# Patient Record
Sex: Male | Born: 1970 | Race: White | Hispanic: No | Marital: Married | Smoking: Never smoker
Health system: Southern US, Community
[De-identification: ages and names within clinical notes are randomized; demographics above are authoritative.]

## PROBLEM LIST (undated history)

## (undated) DIAGNOSIS — R030 Elevated blood-pressure reading, without diagnosis of hypertension: Secondary | ICD-10-CM

## (undated) DIAGNOSIS — Z87442 Personal history of urinary calculi: Secondary | ICD-10-CM

## (undated) DIAGNOSIS — IMO0001 Reserved for inherently not codable concepts without codable children: Secondary | ICD-10-CM

## (undated) HISTORY — DX: Personal history of urinary calculi: Z87.442

## (undated) HISTORY — DX: Elevated blood-pressure reading, without diagnosis of hypertension: R03.0

## (undated) HISTORY — DX: Reserved for inherently not codable concepts without codable children: IMO0001

---

## 2004-05-24 ENCOUNTER — Ambulatory Visit: Payer: Self-pay | Admitting: Family Medicine

## 2004-10-31 ENCOUNTER — Ambulatory Visit: Payer: Self-pay | Admitting: Family Medicine

## 2005-01-20 ENCOUNTER — Ambulatory Visit: Payer: Self-pay | Admitting: Family Medicine

## 2006-03-16 ENCOUNTER — Ambulatory Visit: Payer: Self-pay | Admitting: Specialist

## 2006-11-02 ENCOUNTER — Ambulatory Visit: Payer: Self-pay | Admitting: Family Medicine

## 2006-11-02 DIAGNOSIS — I10 Essential (primary) hypertension: Secondary | ICD-10-CM

## 2006-11-04 ENCOUNTER — Telehealth: Payer: Self-pay | Admitting: Family Medicine

## 2006-12-01 ENCOUNTER — Ambulatory Visit: Payer: Self-pay | Admitting: Family Medicine

## 2006-12-01 LAB — CONVERTED CEMR LAB
CO2: 29 meq/L (ref 19–32)
Creatinine, Ser: 0.9 mg/dL (ref 0.4–1.5)
Potassium: 4.4 meq/L (ref 3.5–5.1)
Sodium: 142 meq/L (ref 135–145)

## 2006-12-03 ENCOUNTER — Ambulatory Visit: Payer: Self-pay | Admitting: Family Medicine

## 2007-06-14 ENCOUNTER — Ambulatory Visit: Payer: Self-pay | Admitting: Family Medicine

## 2007-06-14 DIAGNOSIS — E1169 Type 2 diabetes mellitus with other specified complication: Secondary | ICD-10-CM | POA: Insufficient documentation

## 2007-06-14 DIAGNOSIS — E785 Hyperlipidemia, unspecified: Secondary | ICD-10-CM

## 2007-06-14 LAB — CONVERTED CEMR LAB
ALT: 24 units/L (ref 0–53)
AST: 20 units/L (ref 0–37)
Albumin: 4.4 g/dL (ref 3.5–5.2)
Alkaline Phosphatase: 76 units/L (ref 39–117)
BUN: 9 mg/dL (ref 6–23)
Calcium: 9.4 mg/dL (ref 8.4–10.5)
Chloride: 107 meq/L (ref 96–112)
Cholesterol: 174 mg/dL (ref 0–200)
GFR calc non Af Amer: 81 mL/min
LDL Cholesterol: 111 mg/dL — ABNORMAL HIGH (ref 0–99)
VLDL: 27 mg/dL (ref 0–40)

## 2007-06-17 ENCOUNTER — Ambulatory Visit: Payer: Self-pay | Admitting: Family Medicine

## 2007-06-17 DIAGNOSIS — E119 Type 2 diabetes mellitus without complications: Secondary | ICD-10-CM | POA: Insufficient documentation

## 2007-06-17 DIAGNOSIS — E1169 Type 2 diabetes mellitus with other specified complication: Secondary | ICD-10-CM | POA: Insufficient documentation

## 2007-06-17 DIAGNOSIS — R7303 Prediabetes: Secondary | ICD-10-CM | POA: Insufficient documentation

## 2009-11-06 ENCOUNTER — Encounter (INDEPENDENT_AMBULATORY_CARE_PROVIDER_SITE_OTHER): Payer: Self-pay | Admitting: *Deleted

## 2010-05-01 NOTE — Letter (Signed)
Summary: Nadara Eaton letter  [facility]  [address] [place], [place] [address]   Phone: [phone]  Fax: [phone]       11/06/2009 MRN: [id number]  JONIEL GRAUMANN [address] [place], [place]  [address]  Dear Mr. ABBOTT, [facility] [place], and [facility] announce the retirement of Arta Silence, M.D., from full-time practice at the [facility] office effective September 27, 2009 and his plans of returning part-time.  It is important to Dr. Hetty Ely and to our practice that you understand that [facility] has seven physicians in our office for your health care needs.  We will continue to offer the same exceptional care that you have today.    Dr. Hetty Ely has spoken to many of you about his plans for retirement and returning part-time in the fall.   We will continue to work with you through the transition to schedule appointments for you in the office and meet the high standards that [place] is committed to.   Again, it is with great pleasure that we share the news that Dr. Hetty Ely will return to [facility] at [facility] in October of 2011 with a reduced schedule.    If you have any questions, or would like to request an appointment with one of our physicians, please call us at [phone] and press the option for Scheduling an appointment.  We take pleasure in providing you with excellent patient care and look forward to seeing you at your next office visit.  Our [facility] Physicians are:  Tillman Abide, M.D. Laurita Quint, M.D. Roxy Manns, M.D. Kerby Nora, M.D. Hannah Beat, M.D. Ruthe Mannan, M.D. We proudly welcomed Raechel Ache, M.D. and Eustaquio Boyden, M.D. to the practice in July/August 2011.  Sincerely,  [facility]

## 2011-04-01 DIAGNOSIS — Z87442 Personal history of urinary calculi: Secondary | ICD-10-CM

## 2011-04-01 HISTORY — DX: Personal history of urinary calculi: Z87.442

## 2011-04-10 ENCOUNTER — Emergency Department (HOSPITAL_COMMUNITY)
Admission: EM | Admit: 2011-04-10 | Discharge: 2011-04-10 | Disposition: A | Discharge status: Home / Self Care | Payer: 59 | Attending: Emergency Medicine | Admitting: Emergency Medicine

## 2011-04-10 ENCOUNTER — Encounter (HOSPITAL_COMMUNITY): Payer: Self-pay | Admitting: *Deleted

## 2011-04-10 ENCOUNTER — Emergency Department (HOSPITAL_COMMUNITY): Payer: 59

## 2011-04-10 DIAGNOSIS — R109 Unspecified abdominal pain: Secondary | ICD-10-CM | POA: Insufficient documentation

## 2011-04-10 DIAGNOSIS — N201 Calculus of ureter: Secondary | ICD-10-CM | POA: Insufficient documentation

## 2011-04-10 DIAGNOSIS — N200 Calculus of kidney: Secondary | ICD-10-CM

## 2011-04-10 LAB — URINALYSIS, ROUTINE W REFLEX MICROSCOPIC
Glucose, UA: NEGATIVE mg/dL
Leukocytes, UA: NEGATIVE
Protein, ur: NEGATIVE mg/dL
Specific Gravity, Urine: 1.018 (ref 1.005–1.030)
Urobilinogen, UA: 0.2 mg/dL (ref 0.0–1.0)

## 2011-04-10 LAB — URINE MICROSCOPIC-ADD ON

## 2011-04-10 MED ORDER — ONDANSETRON HCL 4 MG/2ML IJ SOLN
4.0000 mg | Freq: Once | INTRAMUSCULAR | Status: AC
  Administered 2011-04-10: 4 mg via INTRAVENOUS
  Filled 2011-04-10: qty 2

## 2011-04-10 MED ORDER — ONDANSETRON HCL 8 MG PO TABS: 8.0000 mg | ORAL_TABLET | ORAL | Status: AC | PRN

## 2011-04-10 MED ORDER — KETOROLAC TROMETHAMINE 30 MG/ML IJ SOLN
30.0000 mg | Freq: Once | INTRAMUSCULAR | Status: AC
  Administered 2011-04-10: 30 mg via INTRAVENOUS
  Filled 2011-04-10: qty 1

## 2011-04-10 MED ORDER — TAMSULOSIN HCL 0.4 MG PO CAPS: 0.4000 mg | ORAL_CAPSULE | Freq: Every day | ORAL | Status: DC

## 2011-04-10 MED ORDER — OXYCODONE-ACETAMINOPHEN 5-325 MG PO TABS: 2.0000 | ORAL_TABLET | ORAL | Status: AC | PRN

## 2011-04-10 MED ORDER — HYDROMORPHONE HCL PF 1 MG/ML IJ SOLN
1.0000 mg | Freq: Once | INTRAMUSCULAR | Status: AC
  Administered 2011-04-10: 1 mg via INTRAVENOUS
  Filled 2011-04-10: qty 1

## 2011-04-10 NOTE — ED Notes (Signed)
Pt states he started to have right side flank pain on Friday. Pt states pain just felt like pressure. Pt states today pain have move to right lower abdominal area. Pt states he also noted blood in his urine. Pt also c/o n/v x1.

## 2011-04-10 NOTE — ED Provider Notes (Addendum)
History     CSN: [id number]  Arrival date & time 04/10/11  0719   First MD Initiated Contact with Patient 04/10/11 [phone]      Chief Complaint  Patient presents with  . Flank Pain    (Consider location/radiation/quality/duration/timing/severity/associated sxs/prior treatment) HPI... abrupt onset right flank pain this morning with radiation to right lower quadrant. Complains of urgency, decreased urinary flow, hematuria. Similar episode approximately one week ago.  Pain is severe. Nothing makes it better or worse. Described as sharp  History reviewed. No pertinent past medical history.  History reviewed. No pertinent past surgical history.  History reviewed. No pertinent family history.  History  Substance Use Topics  . Smoking status: Never Smoker   . Smokeless tobacco: Not on file  . Alcohol Use: No      Review of Systems  All other systems reviewed and are negative.    Allergies  Review of patient's allergies indicates no known allergies.  Home Medications   Current Outpatient Rx  Name Route Sig Dispense Refill  . HYDROCODONE-ACETAMINOPHEN 7.5-325 MG PO TABS Oral Take 1 tablet by mouth every 4 (four) hours as needed. For pain    . MAGNESIUM CHLORIDE 64 MG PO TBEC Oral Take 1 tablet by mouth daily.    Marland Kitchen VITAMIN B-6 100 MG PO TABS Oral Take 100 mg by mouth daily.      BP 167/111  Pulse 55  Temp(Src) 97.6 F (36.4 C) (Oral)  Resp 22  Ht 5\' 11"  (1.803 m)  Wt 185 lb (83.915 kg)  BMI 25.80 kg/m2  SpO2 100%  Physical Exam  Nursing note and vitals reviewed. Constitutional: He is oriented to person, place, and time. He appears well-developed and well-nourished.  HENT:  Head: Normocephalic and atraumatic.  Eyes: Conjunctivae and EOM are normal. Pupils are equal, round, and reactive to light.  Neck: Normal range of motion. Neck supple.  Cardiovascular: Normal rate and regular rhythm.   Pulmonary/Chest: Effort normal and breath sounds normal.  Abdominal: Soft.  Bowel sounds are normal.  Genitourinary:       Normal genitourinary exam  Musculoskeletal: Normal range of motion. Tenderness: minimal right flank tenderness.  Neurological: He is alert and oriented to person, place, and time.  Skin: Skin is warm and dry.  Psychiatric: He has a normal mood and affect.    ED Course  Procedures (including critical care time)   Labs Reviewed  URINALYSIS, ROUTINE W REFLEX MICROSCOPIC   No results found.   No diagnosis found. Results for orders placed during the hospital encounter of 04/10/11  URINALYSIS, ROUTINE W REFLEX MICROSCOPIC      Component Value Range   Color, Urine YELLOW  YELLOW    APPearance TURBID (*) CLEAR    Specific Gravity, Urine 1.018  1.005 - 1.030    pH 7.5  5.0 - 8.0    Glucose, UA NEGATIVE  NEGATIVE (mg/dL)   Hgb urine dipstick LARGE (*) NEGATIVE    Bilirubin Urine NEGATIVE  NEGATIVE    Ketones, ur NEGATIVE  NEGATIVE (mg/dL)   Protein, ur NEGATIVE  NEGATIVE (mg/dL)   Urobilinogen, UA 0.2  0.0 - 1.0 (mg/dL)   Nitrite NEGATIVE  NEGATIVE    Leukocytes, UA NEGATIVE  NEGATIVE   URINE MICROSCOPIC-ADD ON      Component Value Range   RBC / HPF 21-50  <3 (RBC/hpf)   Bacteria, UA MANY (*) RARE    Urine-Other AMORPHOUS URATES/PHOSPHATES    Ct Abdomen Pelvis Wo Contrast  04/10/2011  *  RADIOLOGY REPORT*  Clinical Data: Right flank and right abdominal pain.  Hematuria.  CT ABDOMEN AND PELVIS WITHOUT CONTRAST  Technique:  Multidetector CT imaging of the abdomen and pelvis was performed following the standard protocol without intravenous contrast.  Comparison: None.  Findings: Lung bases show minimal dependent atelectasis bilaterally.  Heart size normal.  No pericardial or pleural effusion.  Liver, gallbladder and adrenal glands are unremarkable.  Right kidney is edematous with perinephric and periureteric stranding. Mild right hydronephrosis secondary to a 2 mm stone at the right ureteral vesicle junction.  Left kidney, spleen, pancreas,  stomach and bowel are otherwise unremarkable.  Small bilateral inguinal hernias contain fat.  No pathologically enlarged lymph nodes.  No free fluid.  No worrisome lytic or sclerotic lesions. Advanced degenerative disc disease at L5-S1.  IMPRESSION: Minimally obstructing 2 mm right ureteral vesicle junction stone.  Original Report Authenticated By: Reyes Ivan, M.D.     MDM  History and physical most consistent with a kidney stone. Treat pain. Urinalysis. Patient has never had a CT scan so I will obtain same  Recheck at 1100.  Discussed CT scan with patient. Pain is under control. We'll discharge      Donnetta Hutching, MD 04/10/11 9147  Donnetta Hutching, MD 04/10/11 1118

## 2011-07-25 ENCOUNTER — Ambulatory Visit (INDEPENDENT_AMBULATORY_CARE_PROVIDER_SITE_OTHER): Payer: 59 | Admitting: Family Medicine

## 2011-07-25 VITALS — BP 148/89 | HR 67 | Temp 98.6°F | Resp 17 | Ht 69.5 in | Wt 189.8 lb

## 2011-07-25 DIAGNOSIS — M542 Cervicalgia: Secondary | ICD-10-CM

## 2011-07-25 DIAGNOSIS — L03221 Cellulitis of neck: Secondary | ICD-10-CM

## 2011-07-25 DIAGNOSIS — L0211 Cutaneous abscess of neck: Secondary | ICD-10-CM

## 2011-07-25 MED ORDER — DOXYCYCLINE HYCLATE 100 MG PO TABS: 100.0000 mg | ORAL_TABLET | Freq: Two times a day (BID) | ORAL | Status: AC

## 2011-07-25 NOTE — Progress Notes (Signed)
41 yo with 3-4 days of progressive redness, swelling and tenderness right anterior neck.  It has come to a head and is draining yellow green pus.  No prior h/o boils.  Daughter had cheek abscess couple months ago  O:  1 cm boil right anterior neck.  See PA note of I&D above  A:  Infected sebaceous cyst  P:  Doxycycline 100 bid Recheck 48 hours for removal of packing

## 2011-07-25 NOTE — Progress Notes (Signed)
  Subjective:    Patient ID: ASTOR GENTLE, male    DOB: 1971/01/27, 41 y.o.   MRN: [id number]  HPI    Review of Systems     Objective:   Physical Exam  Procedure Note:  VCO.  Neck wound cleansed with betadine and alcohol.  2 cc of 1% lidocaine with epinephrine injected and #15 blade used to open wound.  Culture obtained and thick greenish drainage expressed.  Irrigated with 1 cc of lidocaine and 1/4 packing placed, telfa dressing applied.  Pt tolerated well.      Assessment & Plan:  Abcess/cellulitis of right side neck.  Doxycycline 100 mg BID with food for 7 days.  RTC in 2 days for wound care.  Wound instructions given.

## 2011-07-27 ENCOUNTER — Ambulatory Visit (INDEPENDENT_AMBULATORY_CARE_PROVIDER_SITE_OTHER): Payer: 59 | Admitting: Physician Assistant

## 2011-07-27 VITALS — BP 126/84 | HR 62 | Temp 98.4°F | Resp 16 | Ht 70.0 in | Wt 198.0 lb

## 2011-07-27 DIAGNOSIS — L723 Sebaceous cyst: Secondary | ICD-10-CM

## 2011-07-27 NOTE — Progress Notes (Signed)
   Patient ID: Craig Wade MRN: [id number], DOB: 1970/05/22 41 y.o. Date of Encounter: 07/27/2011, 8:27 AM  Primary Physician: Eustaquio Boyden, MD, MD  Chief Complaint: Wound care   See previous note  HPI: 41 y.o. y/o male presents for wound care s/p I&D on 07/25/11 Doing well No issues or complaints Afebrile/ no chills No nausea or vomiting Tolerating Doxycycline Pain improved Daily dressing change Previous note reviewed  No past medical history on file.   Home Meds: Prior to Admission medications   Medication Sig Start Date End Date Taking? Authorizing Provider  doxycycline (VIBRA-TABS) 100 MG tablet Take 1 tablet (100 mg total) by mouth 2 (two) times daily. 07/25/11 08/04/11 Yes Elvina Sidle, MD  HYDROcodone-acetaminophen (NORCO) 7.5-325 MG per tablet Take 1 tablet by mouth every 4 (four) hours as needed. For pain   Yes Historical Provider, MD  magnesium chloride (SLOW-MAG) 64 MG TBEC Take 1 tablet by mouth daily.   Yes Historical Provider, MD  pyridOXINE (VITAMIN B-6) 100 MG tablet Take 100 mg by mouth daily.   Yes Historical Provider, MD  Tamsulosin HCl (FLOMAX) 0.4 MG CAPS Take 1 capsule (0.4 mg total) by mouth daily. 04/10/11  Yes Donnetta Hutching, MD    Allergies: No Known Allergies  ROS: Constitutional: Afebrile, no chills Cardiovascular: negative for chest pain or palpitations Dermatological: Positive for wound. Negative for erythema, pain, or warmth  GI: No nausea or vomiting   EXAM: Physical Exam: Blood pressure 136/78, pulse 62, temperature 98.4 F (36.9 C), temperature source Oral, resp. rate 16, height 5\' 10"  (1.778 m), weight 198 lb (89.812 kg)., Body mass index is 28.41 kg/(m^2). General: Well developed, well nourished, in no acute distress. Nontoxic appearing. Head: Normocephalic, atraumatic, sclera non-icteric.  Neck: Supple. Lungs: Breathing is unlabored. Heart: Normal rate. Skin:  Warm and moist. Dressing and packing in place. No induration, erythema,  or tenderness to palpation. Neuro: Alert and oriented X 3. Moves all extremities spontaneously. Normal gait.  Psych:  Responds to questions appropriately with a normal affect.   PROCEDURE: Dressing and packing removed. No purulence or sebaceous material expressed Wound bed healthy Irrigated with 1% plain lidocaine 5 cc. Repacked with 1/4 inch plain packing Dressing applied  LAB: Culture: No growth  A/P: 41 y.o. y/o male with cellulitis/abscess as above s/p I&D on 07/25/11 -Wound care per above -Continue Doxycycline -Pain well controlled -Daily dressing changes -Recheck 48 hours  Signed, Eula Listen, PA-C 07/27/2011 8:27 AM

## 2011-07-28 LAB — WOUND CULTURE
Gram Stain: NONE SEEN
Gram Stain: NONE SEEN

## 2011-07-29 ENCOUNTER — Ambulatory Visit (INDEPENDENT_AMBULATORY_CARE_PROVIDER_SITE_OTHER): Payer: 59 | Admitting: Physician Assistant

## 2011-07-29 VITALS — BP 142/95 | HR 58 | Temp 98.4°F | Resp 16 | Ht 69.5 in | Wt 192.8 lb

## 2011-07-29 DIAGNOSIS — L723 Sebaceous cyst: Secondary | ICD-10-CM

## 2011-07-29 DIAGNOSIS — L03221 Cellulitis of neck: Secondary | ICD-10-CM

## 2011-07-29 DIAGNOSIS — L738 Other specified follicular disorders: Secondary | ICD-10-CM

## 2011-07-29 DIAGNOSIS — L0211 Cutaneous abscess of neck: Secondary | ICD-10-CM

## 2011-07-29 NOTE — Progress Notes (Signed)
  Subjective:    Patient ID: Craig Wade, male    DOB: 05-11-1970, 41 y.o.   MRN: [id number]  HPI Presents for wound care s/p I&D of an abscess on the right anterior neck.   No pain.  Minimal drainage.  Tolerating doxycycline without difficulty.  Notes that the dressing change last night resulted in the packing falling out.    Review of Systems As above.    Objective:   Physical Exam  AAOx4. Vital signs noted.  Bandaid removed.  Scant erythema.  Minimal induration consistent with inflammatory changes of healing.  No drainage.  Non-tender.  Wound is closing.  No area to re-pack. Bandaid with antibiotic ointment applied.      Assessment & Plan:   1. Sebaceous cyst   2. Cellulitis and abscess of neck    Local wound care. Patient Instructions  Complete the antibiotics you were given.  Return for re-evaluation if your pain worsens, you develop increased swelling, redness or if drainage begins again.

## 2011-07-29 NOTE — Patient Instructions (Signed)
Complete the antibiotics you were given.  Return for re-evaluation if your pain worsens, you develop increased swelling, redness or if drainage begins again.

## 2011-12-26 ENCOUNTER — Encounter: Payer: Self-pay | Admitting: Family Medicine

## 2011-12-26 ENCOUNTER — Ambulatory Visit (INDEPENDENT_AMBULATORY_CARE_PROVIDER_SITE_OTHER): Payer: 59 | Admitting: Family Medicine

## 2011-12-26 VITALS — BP 136/88 | HR 63 | Temp 98.2°F | Wt 196.0 lb

## 2011-12-26 DIAGNOSIS — M7710 Lateral epicondylitis, unspecified elbow: Secondary | ICD-10-CM

## 2011-12-26 DIAGNOSIS — M7711 Lateral epicondylitis, right elbow: Secondary | ICD-10-CM

## 2011-12-26 MED ORDER — NAPROXEN 500 MG PO TABS: ORAL_TABLET | ORAL | Status: DC

## 2011-12-26 NOTE — Patient Instructions (Signed)
You have tennis elbow of both arms. Take naprosyn twice daily with food for next 5-7 days then just as needed. Do stretching exercises provided today. Continue using elbow strap. Try to back off repetitive work with hands. If not improving please let us know.

## 2011-12-26 NOTE — Progress Notes (Signed)
  Subjective:    Patient ID: Craig Wade, male    DOB: May 30, 1970, 41 y.o.   MRN: [id number]  HPI CC: elbow pain  2 mo h/o R>L elbow pain.  Worse with lifting with forearm in pronation and with palpation.  Also tender with grazing edge of elbow against anything.  Yesterday took ibuprofen which helped.  Repetitive wrist and elbow motion at work.  More busy at work last 2 months.  Using tennis elbow strap. Has had this in past.  Medications and allergies reviewed and updated in chart.  Past histories reviewed and updated if relevant as below. Patient Active Problem List  Diagnosis  . HYPERCHOLESTEROLEMIA  . HYPERTENSION, BENIGN ESSENTIAL  . HYPERGLYCEMIA   Past Medical History  Diagnosis Date  . History of kidney stones 04/2011  . Elevated BP     improved with weight loss   No past surgical history on file. History  Substance Use Topics  . Smoking status: Never Smoker   . Smokeless tobacco: Never Used  . Alcohol Use: No   Family History  Problem Relation Age of Onset  . Stroke Paternal Grandfather 57  . Cancer Paternal Grandmother 2    liver, nondrinker  . Coronary artery disease Neg Hx   . Diabetes Neg Hx    No Known Allergies No current outpatient prescriptions on file prior to visit.     Review of Systems Per HPI    Objective:   Physical Exam  Nursing note and vitals reviewed. Constitutional: He appears well-developed and well-nourished. No distress.  Musculoskeletal: He exhibits no edema.       FROM at elbows, wrists. Tender at lateral epicondyles bilaterally R>L. No other elbow pain with palpation. Pain with extension against resistance. Not significant pain with pronation/supination bilaterally.  Neurological: He is alert. He has normal strength. No sensory deficit.  Skin: Skin is warm and dry. No rash noted.  Psychiatric: He has a normal mood and affect.       Assessment & Plan:

## 2011-12-26 NOTE — Assessment & Plan Note (Signed)
See pt instructions for plan. Stretching/strengthening exercises provided from [facility] pt advisor.

## 2013-03-25 ENCOUNTER — Ambulatory Visit (INDEPENDENT_AMBULATORY_CARE_PROVIDER_SITE_OTHER): Payer: 59 | Admitting: Family Medicine

## 2013-03-25 ENCOUNTER — Encounter: Payer: Self-pay | Admitting: Family Medicine

## 2013-03-25 VITALS — BP 152/110 | HR 100 | Temp 99.6°F | Wt 196.8 lb

## 2013-03-25 DIAGNOSIS — R030 Elevated blood-pressure reading, without diagnosis of hypertension: Secondary | ICD-10-CM

## 2013-03-25 DIAGNOSIS — IMO0001 Reserved for inherently not codable concepts without codable children: Secondary | ICD-10-CM | POA: Insufficient documentation

## 2013-03-25 DIAGNOSIS — Z23 Encounter for immunization: Secondary | ICD-10-CM

## 2013-03-25 DIAGNOSIS — J111 Influenza due to unidentified influenza virus with other respiratory manifestations: Secondary | ICD-10-CM

## 2013-03-25 MED ORDER — HYDROCODONE-HOMATROPINE 5-1.5 MG/5ML PO SYRP: 5.0000 mL | ORAL_SOLUTION | Freq: Three times a day (TID) | ORAL | Status: DC | PRN

## 2013-03-25 NOTE — Assessment & Plan Note (Signed)
Tired but nontoxic. Clinical dx based on sxs and prevalence currently in community Did not swab today. Supportive care as per instructions. Hycodan for cough at night time. Red flags to return discussed. Pt agrees.

## 2013-03-25 NOTE — Assessment & Plan Note (Signed)
Reviewed with patient - will monitor at home and if when feeling better still high, will return to office to start antihypertensive.

## 2013-03-25 NOTE — Patient Instructions (Signed)
Let's keep an eye on blood pressure - if consistently >140/90 then come back for bp medicine. I think you have the flu - treat with increased water intake,plenty of rest. Continue ibuprofen and mucinex. Cough syrup for night time. Let us know if fever >101 persistent, or worsening productive cough.

## 2013-03-25 NOTE — Progress Notes (Addendum)
   Subjective:    Patient ID: Craig Wade, male    DOB: 1971/01/25, 42 y.o.   MRN: [id number]  HPI CC: cough  Cough started 4d ago, then body aches over christmas eve.  Cough productive of mild phlegm, headache, congestion.  Feverish, chills, rhinorrhea.  Feels more congested in throat/chest.  No abd pain, ear or tooth pain, ST, PNdrainage.  Has tried daytime theraflu.and mucinex. Daughter sick recently. No smokers at home. No h/o asthma, COPD, allergic rhinitis.  bp elevated today - initially improved with weight loss, now returning.  Wt Readings from Last 3 Encounters:  03/25/13 196 lb 12 oz (89.245 kg)  12/26/11 196 lb (88.905 kg)  07/29/11 192 lb 12.8 oz (87.454 kg)   Past Medical History  Diagnosis Date  . History of kidney stones 04/2011  . Elevated BP     improved with weight loss    Review of Systems Per HPI    Objective:   Physical Exam  Nursing note and vitals reviewed. Constitutional: He appears well-developed and well-nourished. No distress.  HENT:  Head: Normocephalic and atraumatic.  Right Ear: Hearing, tympanic membrane, external ear and ear canal normal.  Left Ear: Hearing, tympanic membrane, external ear and ear canal normal.  Nose: Mucosal edema present. No rhinorrhea. Right sinus exhibits no maxillary sinus tenderness and no frontal sinus tenderness. Left sinus exhibits no maxillary sinus tenderness and no frontal sinus tenderness.  Mouth/Throat: Uvula is midline and mucous membranes are normal. Posterior oropharyngeal edema and posterior oropharyngeal erythema present. No oropharyngeal exudate or tonsillar abscesses.  Eyes: Conjunctivae and EOM are normal. Pupils are equal, round, and reactive to light. No scleral icterus.  Neck: Normal range of motion. Neck supple.  Cardiovascular: Normal rate, regular rhythm, normal heart sounds and intact distal pulses.   No murmur heard. Pulmonary/Chest: Effort normal and breath sounds normal. No respiratory  distress. He has no wheezes. He has no rales.  Lymphadenopathy:    He has no cervical adenopathy.  Skin: Skin is warm and dry. No rash noted.       Assessment & Plan:  ADDENDUM ==> MISTAKEN ENTRY Pt did not receive flu shot - I have asked Melanie to remove this from pt's chart.

## 2013-03-25 NOTE — Addendum Note (Signed)
Addended by: Roena Malady on: 03/25/2013 04:53 PM   Modules accepted: Orders

## 2013-03-25 NOTE — Progress Notes (Signed)
Pre-visit discussion using our clinic review tool. No additional management support is needed unless otherwise documented below in the visit note.  

## 2013-08-26 ENCOUNTER — Telehealth: Payer: Self-pay | Admitting: Family Medicine

## 2013-08-26 NOTE — Telephone Encounter (Signed)
Pts spouse dropped off form along with her's to be filled out by Dr Danise Mina. Please call when form are ready to be picked up. Form placed on Craig Wade's desk to give to Dr Danise Mina Thank you

## 2013-08-29 NOTE — Telephone Encounter (Signed)
This is physical form. Unfortunately neither pt nor wife have had a physical in the last year.  plz call to schedule CPE if they desire forms filled out. Placed forms in Kim's box.

## 2013-08-30 NOTE — Telephone Encounter (Signed)
Left vm for pt or wife to return call.  

## 2013-09-01 NOTE — Telephone Encounter (Signed)
Appt scheduled

## 2013-09-15 ENCOUNTER — Encounter: Payer: 59 | Admitting: Family Medicine

## 2013-09-27 ENCOUNTER — Ambulatory Visit (INDEPENDENT_AMBULATORY_CARE_PROVIDER_SITE_OTHER): Payer: BC Managed Care – PPO | Admitting: Family Medicine

## 2013-09-27 ENCOUNTER — Encounter: Payer: Self-pay | Admitting: Family Medicine

## 2013-09-27 VITALS — BP 138/84 | HR 80 | Temp 97.7°F | Ht 70.5 in | Wt 198.8 lb

## 2013-09-27 DIAGNOSIS — E7800 Pure hypercholesterolemia, unspecified: Secondary | ICD-10-CM

## 2013-09-27 DIAGNOSIS — R7309 Other abnormal glucose: Secondary | ICD-10-CM

## 2013-09-27 DIAGNOSIS — Z0000 Encounter for general adult medical examination without abnormal findings: Secondary | ICD-10-CM

## 2013-09-27 DIAGNOSIS — IMO0001 Reserved for inherently not codable concepts without codable children: Secondary | ICD-10-CM

## 2013-09-27 DIAGNOSIS — R030 Elevated blood-pressure reading, without diagnosis of hypertension: Secondary | ICD-10-CM

## 2013-09-27 NOTE — Assessment & Plan Note (Signed)
Return fasting for cbg.

## 2013-09-27 NOTE — Progress Notes (Signed)
Pre visit review using our clinic review tool, if applicable. No additional management support is needed unless otherwise documented below in the visit note. 

## 2013-09-27 NOTE — Assessment & Plan Note (Signed)
Will return fasting for FLP.

## 2013-09-27 NOTE — Assessment & Plan Note (Signed)
BP Readings from Last 3 Encounters:  09/27/13 138/84  03/25/13 152/110  12/26/11 136/88  cut out soft drinks and only drinking water.

## 2013-09-27 NOTE — Progress Notes (Signed)
BP 138/84  Pulse 80  Temp(Src) 97.7 F (36.5 C) (Oral)  Ht 5' 10.5" (1.791 m)  Wt 198 lb 12 oz (90.152 kg)  BMI 28.11 kg/m2   CC: CPE  Subjective:    Patient ID: Craig Wade, male    DOB: [birth year], 43 y.o.   MRN: [id number]  HPI: Craig Wade is a 43 y.o. male presenting on 09/27/2013 for Annual Exam   Occasional R eye gets blurry - notes this when shooting long distances. No recent eye exam.   Preventative: Tetanus - >6 yrs ago. Declines today. Seat belt 100% use Sunscreen use discussed. Denies changing moles.  Not fasting today. Had mcchicken biscuit and water today.  Lives with wife, 2 children, 1 dog Occupation: Arboriculturist tile Activity: no regular exercise Diet: good water, some fruits/vegetables   Relevant past medical, surgical, family and social history reviewed and updated as indicated.  Allergies and medications reviewed and updated. No current outpatient prescriptions on file prior to visit.   No current facility-administered medications on file prior to visit.    Review of Systems  Constitutional: Negative for fever, chills, activity change, appetite change, fatigue and unexpected weight change.  HENT: Negative for hearing loss.   Eyes: Negative for visual disturbance.  Respiratory: Negative for cough, chest tightness, shortness of breath and wheezing.   Cardiovascular: Negative for chest pain, palpitations and leg swelling.  Gastrointestinal: Negative for nausea, vomiting, abdominal pain, diarrhea, constipation, blood in stool and abdominal distention.  Genitourinary: Negative for hematuria and difficulty urinating.  Musculoskeletal: Negative for arthralgias, myalgias and neck pain.  Skin: Negative for rash.  Neurological: Negative for dizziness, seizures, syncope and headaches.  Hematological: Negative for adenopathy. Does not bruise/bleed easily.  Psychiatric/Behavioral: Negative for dysphoric mood. The patient is not nervous/anxious.     Per HPI unless specifically indicated above    Objective:    BP 138/84  Pulse 80  Temp(Src) 97.7 F (36.5 C) (Oral)  Ht 5' 10.5" (1.791 m)  Wt 198 lb 12 oz (90.152 kg)  BMI 28.11 kg/m2  Physical Exam  Nursing note and vitals reviewed. Constitutional: He is oriented to person, place, and time. He appears well-developed and well-nourished. No distress.  HENT:  Head: Normocephalic and atraumatic.  Right Ear: Hearing, tympanic membrane, external ear and ear canal normal.  Left Ear: Hearing, tympanic membrane, external ear and ear canal normal.  Nose: Nose normal.  Mouth/Throat: Uvula is midline, oropharynx is clear and moist and mucous membranes are normal. No oropharyngeal exudate, posterior oropharyngeal edema or posterior oropharyngeal erythema.  Eyes: Conjunctivae and EOM are normal. Pupils are equal, round, and reactive to light. No scleral icterus.  Neck: Normal range of motion. Neck supple. No thyromegaly present.  Cardiovascular: Normal rate, regular rhythm, normal heart sounds and intact distal pulses.   No murmur heard. Pulses:      Radial pulses are 2+ on the right side, and 2+ on the left side.  Pulmonary/Chest: Effort normal and breath sounds normal. No respiratory distress. He has no wheezes. He has no rales.  Abdominal: Soft. Bowel sounds are normal. He exhibits no distension and no mass. There is no tenderness. There is no rebound and no guarding.  Musculoskeletal: Normal range of motion. He exhibits no edema.  Lymphadenopathy:    He has no cervical adenopathy.  Neurological: He is alert and oriented to person, place, and time.  CN grossly intact, station and gait intact  Skin: Skin is warm and dry. No rash  noted.  Psychiatric: He has a normal mood and affect. His behavior is normal. Judgment and thought content normal.       Assessment & Plan:   Problem List Items Addressed This Visit   HYPERGLYCEMIA     Return fasting for cbg.    HYPERCHOLESTEROLEMIA      Will return fasting for FLP.    Health maintenance examination - Primary     Preventative protocols reviewed and updated unless pt declined. Discussed healthy diet and lifestyle.     Elevated blood pressure      BP Readings from Last 3 Encounters:  09/27/13 138/84  03/25/13 152/110  12/26/11 136/88  cut out soft drinks and only drinking water.        Follow up plan: Return in about 2 years (around 09/28/2015), or as needed, for physical.

## 2013-09-27 NOTE — Patient Instructions (Addendum)
Return at your convenience fasting for blood work. Vision screen today. Good to see you today, call us with questions. Return as needed or in 1-2 yrs for next physical.

## 2013-09-27 NOTE — Assessment & Plan Note (Signed)
Preventative protocols reviewed and updated unless pt declined. Discussed healthy diet and lifestyle.  

## 2013-09-28 ENCOUNTER — Other Ambulatory Visit (INDEPENDENT_AMBULATORY_CARE_PROVIDER_SITE_OTHER): Payer: BC Managed Care – PPO

## 2013-09-28 DIAGNOSIS — E7800 Pure hypercholesterolemia, unspecified: Secondary | ICD-10-CM

## 2013-09-28 DIAGNOSIS — R7309 Other abnormal glucose: Secondary | ICD-10-CM

## 2013-09-28 DIAGNOSIS — R030 Elevated blood-pressure reading, without diagnosis of hypertension: Secondary | ICD-10-CM

## 2013-09-28 DIAGNOSIS — IMO0001 Reserved for inherently not codable concepts without codable children: Secondary | ICD-10-CM

## 2013-09-28 LAB — LIPID PANEL
CHOL/HDL RATIO: 4
Cholesterol: 160 mg/dL (ref 0–200)
HDL: 39.9 mg/dL (ref >39.00)
LDL CALC: 91 mg/dL (ref 0–99)
NONHDL: 120.1
TRIGLYCERIDES: 144 mg/dL (ref 0.0–149.0)
VLDL: 28.8 mg/dL (ref 0.0–40.0)

## 2013-09-28 LAB — BASIC METABOLIC PANEL
BUN: 13 mg/dL (ref 6–23)
CHLORIDE: 106 meq/L (ref 96–112)
CO2: 28 mEq/L (ref 19–32)
Calcium: 9 mg/dL (ref 8.4–10.5)
Creatinine, Ser: 1 mg/dL (ref 0.4–1.5)
GFR: 84.75 mL/min (ref >60.00)
Glucose, Bld: 107 mg/dL — ABNORMAL HIGH (ref 70–99)
POTASSIUM: 4.5 meq/L (ref 3.5–5.1)
SODIUM: 139 meq/L (ref 135–145)

## 2015-02-07 ENCOUNTER — Other Ambulatory Visit: Payer: Self-pay | Admitting: Family Medicine

## 2015-02-07 DIAGNOSIS — E7800 Pure hypercholesterolemia, unspecified: Secondary | ICD-10-CM

## 2015-02-07 DIAGNOSIS — R7309 Other abnormal glucose: Secondary | ICD-10-CM

## 2015-02-08 ENCOUNTER — Other Ambulatory Visit (INDEPENDENT_AMBULATORY_CARE_PROVIDER_SITE_OTHER): Payer: BLUE CROSS/BLUE SHIELD

## 2015-02-08 DIAGNOSIS — E7800 Pure hypercholesterolemia, unspecified: Secondary | ICD-10-CM | POA: Diagnosis not present

## 2015-02-08 DIAGNOSIS — R7309 Other abnormal glucose: Secondary | ICD-10-CM | POA: Diagnosis not present

## 2015-02-08 LAB — BASIC METABOLIC PANEL
BUN: 15 mg/dL (ref 6–23)
CALCIUM: 9.6 mg/dL (ref 8.4–10.5)
CO2: 27 meq/L (ref 19–32)
CREATININE: 1 mg/dL (ref 0.40–1.50)
Chloride: 104 mEq/L (ref 96–112)
GFR: 86.16 mL/min (ref >60.00)
GLUCOSE: 116 mg/dL — AB (ref 70–99)
Potassium: 4.4 mEq/L (ref 3.5–5.1)
Sodium: 139 mEq/L (ref 135–145)

## 2015-02-08 LAB — LIPID PANEL
CHOLESTEROL: 167 mg/dL (ref 0–200)
HDL: 39.8 mg/dL (ref >39.00)
LDL CALC: 101 mg/dL — AB (ref 0–99)
NonHDL: 127.41
Total CHOL/HDL Ratio: 4
Triglycerides: 130 mg/dL (ref 0.0–149.0)
VLDL: 26 mg/dL (ref 0.0–40.0)

## 2015-02-16 ENCOUNTER — Ambulatory Visit (INDEPENDENT_AMBULATORY_CARE_PROVIDER_SITE_OTHER): Payer: BLUE CROSS/BLUE SHIELD | Admitting: Family Medicine

## 2015-02-16 ENCOUNTER — Encounter: Payer: Self-pay | Admitting: Family Medicine

## 2015-02-16 VITALS — BP 126/88 | HR 68 | Temp 97.7°F | Ht 70.5 in | Wt 188.8 lb

## 2015-02-16 DIAGNOSIS — Z23 Encounter for immunization: Secondary | ICD-10-CM

## 2015-02-16 DIAGNOSIS — L989 Disorder of the skin and subcutaneous tissue, unspecified: Secondary | ICD-10-CM

## 2015-02-16 DIAGNOSIS — R7309 Other abnormal glucose: Secondary | ICD-10-CM

## 2015-02-16 DIAGNOSIS — E7800 Pure hypercholesterolemia, unspecified: Secondary | ICD-10-CM

## 2015-02-16 DIAGNOSIS — Z0000 Encounter for general adult medical examination without abnormal findings: Secondary | ICD-10-CM

## 2015-02-16 DIAGNOSIS — E041 Nontoxic single thyroid nodule: Secondary | ICD-10-CM

## 2015-02-16 NOTE — Assessment & Plan Note (Signed)
Preventative protocols reviewed and updated unless pt declined. Discussed healthy diet and lifestyle.  

## 2015-02-16 NOTE — Assessment & Plan Note (Signed)
Encouraged avoid added sugars. Check A1c next labwork.

## 2015-02-16 NOTE — Progress Notes (Signed)
Pre visit review using our clinic review tool, if applicable. No additional management support is needed unless otherwise documented below in the visit note. 

## 2015-02-16 NOTE — Progress Notes (Addendum)
BP 126/88 mmHg  Pulse 68  Temp(Src) 97.7 F (36.5 C) (Oral)  Ht 5' 10.5" (1.791 m)  Wt 188 lb 12 oz (85.616 kg)  BMI 26.69 kg/m2  CC: CPE  Subjective:    Patient ID: Craig Wade, male    DOB: 1970/12/25, 44 y.o.   MRN: [id number]  HPI: Craig Wade is a 44 y.o. male presenting on 02/16/2015 for No chief complaint on file.   Last seen here 08/2013 for CPE. 10 lb weight loss noted. This has helped control blood pressure well.   Preventative: Flu shot - declines Tdap today. Seat belt 100% use Sunscreen use discussed. Denies changing moles. Wants mole checked  Lives with wife, 2 children, 1 dog  Occupation: Arboriculturist tile  Activity: no regular exercise  Diet: good water, some fruits/vegetables   Relevant past medical, surgical, family and social history reviewed and updated as indicated. Interim medical history since our last visit reviewed. Allergies and medications reviewed and updated. No current outpatient prescriptions on file prior to visit.   No current facility-administered medications on file prior to visit.    Review of Systems  Constitutional: Negative for fever, chills, activity change, appetite change, fatigue and unexpected weight change.  HENT: Negative for hearing loss.   Eyes: Negative for visual disturbance.  Respiratory: Negative for cough, chest tightness, shortness of breath and wheezing.   Cardiovascular: Negative for chest pain, palpitations and leg swelling.  Gastrointestinal: Negative for nausea, vomiting, abdominal pain, diarrhea, constipation, blood in stool and abdominal distention.  Genitourinary: Negative for hematuria and difficulty urinating.  Musculoskeletal: Negative for myalgias, arthralgias and neck pain.  Skin: Negative for rash.  Neurological: Negative for dizziness, seizures, syncope and headaches.  Hematological: Negative for adenopathy. Does not bruise/bleed easily.  Psychiatric/Behavioral: Negative for dysphoric mood.  The patient is not nervous/anxious.    Per HPI unless specifically indicated in ROS section     Objective:    BP 126/88 mmHg  Pulse 68  Temp(Src) 97.7 F (36.5 C) (Oral)  Ht 5' 10.5" (1.791 m)  Wt 188 lb 12 oz (85.616 kg)  BMI 26.69 kg/m2  Wt Readings from Last 3 Encounters:  02/16/15 188 lb 12 oz (85.616 kg)  09/27/13 198 lb 12 oz (90.152 kg)  03/25/13 196 lb 12 oz (89.245 kg)    Physical Exam  Constitutional: He is oriented to person, place, and time. He appears well-developed and well-nourished. No distress.  HENT:  Head: Normocephalic and atraumatic.  Right Ear: Hearing, tympanic membrane, external ear and ear canal normal.  Left Ear: Hearing, tympanic membrane, external ear and ear canal normal.  Nose: Nose normal.  Mouth/Throat: Uvula is midline, oropharynx is clear and moist and mucous membranes are normal. No oropharyngeal exudate, posterior oropharyngeal edema or posterior oropharyngeal erythema.  Eyes: Conjunctivae and EOM are normal. Pupils are equal, round, and reactive to light. No scleral icterus.  Neck: Normal range of motion. Neck supple. No thyromegaly (?L thyroid nodule) present.  Cardiovascular: Normal rate, regular rhythm, normal heart sounds and intact distal pulses.   No murmur heard. Pulses:      Radial pulses are 2+ on the right side, and 2+ on the left side.  Pulmonary/Chest: Effort normal and breath sounds normal. No respiratory distress. He has no wheezes. He has no rales.  Abdominal: Soft. Bowel sounds are normal. He exhibits no distension and no mass. There is no tenderness. There is no rebound and no guarding.  Musculoskeletal: Normal range of motion. He  exhibits no edema.  Lymphadenopathy:    He has no cervical adenopathy.  Neurological: He is alert and oriented to person, place, and time.  CN grossly intact, station and gait intact  Skin: Skin is warm and dry. No rash noted.  Psychiatric: He has a normal mood and affect. His behavior is normal.  Judgment and thought content normal.  Nursing note and vitals reviewed.  Results for orders placed or performed in visit on 02/08/15  Lipid panel  Result Value Ref Range   Cholesterol 167 0 - 200 mg/dL   Triglycerides 130.0 0.0 - 149.0 mg/dL   HDL 39.80 >39.00 mg/dL   VLDL 26.0 0.0 - 40.0 mg/dL   LDL Cholesterol 101 (H) 0 - 99 mg/dL   Total CHOL/HDL Ratio 4    NonHDL 123456   Basic metabolic panel  Result Value Ref Range   Sodium 139 135 - 145 mEq/L   Potassium 4.4 3.5 - 5.1 mEq/L   Chloride 104 96 - 112 mEq/L   CO2 27 19 - 32 mEq/L   Glucose, Bld 116 (H) 70 - 99 mg/dL   BUN 15 6 - 23 mg/dL   Creatinine, Ser 1.00 0.40 - 1.50 mg/dL   Calcium 9.6 8.4 - 10.5 mg/dL   GFR 86.16 >60.00 mL/min      Assessment & Plan:   Problem List Items Addressed This Visit    Skin lesion of left lower extremity    Anticipate benign. Return for excision.      Left thyroid nodule    Palpated today - check [place].      Relevant Orders   US Soft Tissue Head/Neck   HYPERGLYCEMIA    Encouraged avoid added sugars. Check A1c next labwork.      HYPERCHOLESTEROLEMIA    Minimal off meds.      Health maintenance examination - Primary    Preventative protocols reviewed and updated unless pt declined. Discussed healthy diet and lifestyle.        Other Visit Diagnoses    Need for Tdap vaccination        Relevant Orders    Tdap vaccine greater than or equal to 7yo IM (Completed)        Follow up plan: Return in about 1 year (around 02/16/2016), or as needed, for annual exam, prior fasting for blood work.

## 2015-02-16 NOTE — Assessment & Plan Note (Signed)
Palpated today - check [place].

## 2015-02-16 NOTE — Assessment & Plan Note (Addendum)
Anticipate benign. Return for excision.

## 2015-02-16 NOTE — Patient Instructions (Addendum)
Tdap (tetanus and whooping cough) today. We will set you up for thyroid ultrasound.  Nice to see you today, call us with quesitons Return as needed or in 1 year for next physical.  Health Maintenance, Male A healthy lifestyle and preventative care can promote health and wellness.  Maintain regular health, dental, and eye exams.  Eat a healthy diet. Foods like vegetables, fruits, whole grains, low-fat dairy products, and lean protein foods contain the nutrients you need and are low in calories. Decrease your intake of foods high in solid fats, added sugars, and salt. Get information about a proper diet from your health care provider, if necessary.  Regular physical exercise is one of the most important things you can do for your health. Most adults should get at least 150 minutes of moderate-intensity exercise (any activity that increases your heart rate and causes you to sweat) each week. In addition, most adults need muscle-strengthening exercises on 2 or more days a week.   Maintain a healthy weight. The body mass index (BMI) is a screening tool to identify possible weight problems. It provides an estimate of body fat based on height and weight. Your health care provider can find your BMI and can help you achieve or maintain a healthy weight. For males 20 years and older:  A BMI below 18.5 is considered underweight.  A BMI of 18.5 to 24.9 is normal.  A BMI of 25 to 29.9 is considered overweight.  A BMI of 30 and above is considered obese.  Maintain normal blood lipids and cholesterol by exercising and minimizing your intake of saturated fat. Eat a balanced diet with plenty of fruits and vegetables. Blood tests for lipids and cholesterol should begin at age 46 and be repeated every 5 years. If your lipid or cholesterol levels are high, you are over age 6, or you are at high risk for heart disease, you may need your cholesterol levels checked more frequently.Ongoing high lipid and  cholesterol levels should be treated with medicines if diet and exercise are not working.  If you smoke, find out from your health care provider how to quit. If you do not use tobacco, do not start.  Lung cancer screening is recommended for adults aged 17-80 years who are at high risk for developing lung cancer because of a history of smoking. A yearly low-dose CT scan of the lungs is recommended for people who have at least a 30-pack-year history of smoking and are current smokers or have quit within the past 15 years. A pack year of smoking is smoking an average of 1 pack of cigarettes a day for 1 year (for example, a 30-pack-year history of smoking could mean smoking 1 pack a day for 30 years or 2 packs a day for 15 years). Yearly screening should continue until the smoker has stopped smoking for at least 15 years. Yearly screening should be stopped for people who develop a health problem that would prevent them from having lung cancer treatment.  If you choose to drink alcohol, do not have more than 2 drinks per day. One drink is considered to be 12 oz (360 mL) of beer, 5 oz (150 mL) of wine, or 1.5 oz (45 mL) of liquor.  Avoid the use of street drugs. Do not share needles with anyone. Ask for help if you need support or instructions about stopping the use of drugs.  High blood pressure causes heart disease and increases the risk of stroke. High blood pressure is  more likely to develop in:  People who have blood pressure in the end of the normal range (100-139/85-89 mm Hg).  People who are overweight or obese.  People who are African American.  If you are 35-67 years of age, have your blood pressure checked every 3-5 years. If you are 43 years of age or older, have your blood pressure checked every year. You should have your blood pressure measured twice--once when you are at a hospital or clinic, and once when you are not at a hospital or clinic. Record the average of the two measurements. To  check your blood pressure when you are not at a hospital or clinic, you can use:  An automated blood pressure machine at a pharmacy.  A home blood pressure monitor.  If you are 2-56 years old, ask your health care provider if you should take aspirin to prevent heart disease.  Diabetes screening involves taking a blood sample to check your fasting blood sugar level. This should be done once every 3 years after age 79 if you are at a normal weight and without risk factors for diabetes. Testing should be considered at a younger age or be carried out more frequently if you are overweight and have at least 1 risk factor for diabetes.  Colorectal cancer can be detected and often prevented. Most routine colorectal cancer screening begins at the age of 63 and continues through age 22. However, your health care provider may recommend screening at an earlier age if you have risk factors for colon cancer. On a yearly basis, your health care provider may provide home test kits to check for hidden blood in the stool. A small camera at the end of a tube may be used to directly examine the colon (sigmoidoscopy or colonoscopy) to detect the earliest forms of colorectal cancer. Talk to your health care provider about this at age 47 when routine screening begins. A direct exam of the colon should be repeated every 5-10 years through age 20, unless early forms of precancerous polyps or small growths are found.  People who are at an increased risk for hepatitis B should be screened for this virus. You are considered at high risk for hepatitis B if:  You were born in a country where hepatitis B occurs often. Talk with your health care provider about which countries are considered high risk.  Your parents were born in a high-risk country and you have not received a shot to protect against hepatitis B (hepatitis B vaccine).  You have HIV or AIDS.  You use needles to inject street drugs.  You live with, or have sex  with, someone who has hepatitis B.  You are a man who has sex with other men (MSM).  You get hemodialysis treatment.  You take certain medicines for conditions like cancer, organ transplantation, and autoimmune conditions.  Hepatitis C blood testing is recommended for all people born from [birth year] through 1965 and any individual with known risk factors for hepatitis C.  Healthy men should no longer receive prostate-specific antigen (PSA) blood tests as part of routine cancer screening. Talk to your health care provider about prostate cancer screening.  Testicular cancer screening is not recommended for adolescents or adult males who have no symptoms. Screening includes self-exam, a health care provider exam, and other screening tests. Consult with your health care provider about any symptoms you have or any concerns you have about testicular cancer.  Practice safe sex. Use condoms and avoid high-risk sexual  practices to reduce the spread of sexually transmitted infections (STIs).  You should be screened for STIs, including gonorrhea and chlamydia if:  You are sexually active and are younger than 24 years.  You are older than 24 years, and your health care provider tells you that you are at risk for this type of infection.  Your sexual activity has changed since you were last screened, and you are at an increased risk for chlamydia or gonorrhea. Ask your health care provider if you are at risk.  If you are at risk of being infected with HIV, it is recommended that you take a prescription medicine daily to prevent HIV infection. This is called pre-exposure prophylaxis (PrEP). You are considered at risk if:  You are a man who has sex with other men (MSM).  You are a heterosexual man who is sexually active with multiple partners.  You take drugs by injection.  You are sexually active with a partner who has HIV.  Talk with your health care provider about whether you are at high risk of being  infected with HIV. If you choose to begin PrEP, you should first be tested for HIV. You should then be tested every 3 months for as long as you are taking PrEP.  Use sunscreen. Apply sunscreen liberally and repeatedly throughout the day. You should seek shade when your shadow is shorter than you. Protect yourself by wearing long sleeves, pants, a wide-brimmed hat, and sunglasses year round whenever you are outdoors.  Tell your health care provider of new moles or changes in moles, especially if there is a change in shape or color. Also, tell your health care provider if a mole is larger than the size of a pencil eraser.  A one-time screening for abdominal aortic aneurysm (AAA) and surgical repair of large AAAs by ultrasound is recommended for men aged 65-75 years who are current or former smokers.  Stay current with your vaccines (immunizations).   This information is not intended to replace advice given to you by your health care provider. Make sure you discuss any questions you have with your health care provider.   Document Released: 09/13/2007 Document Revised: 04/07/2014 Document Reviewed: 08/12/2010 Elsevier Interactive Patient Education [employer].

## 2015-02-16 NOTE — Assessment & Plan Note (Signed)
Minimal off meds. 

## 2015-02-21 ENCOUNTER — Ambulatory Visit
Admission: RE | Admit: 2015-02-21 | Discharge: 2015-02-21 | Disposition: A | Discharge status: Home / Self Care | Payer: BLUE CROSS/BLUE SHIELD | Source: Ambulatory Visit | Attending: Family Medicine | Admitting: Family Medicine

## 2015-02-21 DIAGNOSIS — E041 Nontoxic single thyroid nodule: Secondary | ICD-10-CM

## 2015-02-26 ENCOUNTER — Encounter: Payer: Self-pay | Admitting: *Deleted

## 2015-03-09 ENCOUNTER — Encounter: Payer: Self-pay | Admitting: Family Medicine

## 2015-03-09 ENCOUNTER — Ambulatory Visit (INDEPENDENT_AMBULATORY_CARE_PROVIDER_SITE_OTHER): Payer: Managed Care, Other (non HMO) | Admitting: Family Medicine

## 2015-03-09 VITALS — BP 138/96 | HR 60 | Temp 98.4°F | Wt 194.2 lb

## 2015-03-09 DIAGNOSIS — D492 Neoplasm of unspecified behavior of bone, soft tissue, and skin: Secondary | ICD-10-CM | POA: Diagnosis not present

## 2015-03-09 DIAGNOSIS — L989 Disorder of the skin and subcutaneous tissue, unspecified: Secondary | ICD-10-CM | POA: Diagnosis not present

## 2015-03-09 NOTE — Progress Notes (Signed)
Pre visit review using our clinic review tool, if applicable. No additional management support is needed unless otherwise documented below in the visit note. 

## 2015-03-09 NOTE — Patient Instructions (Signed)
Skin lesion removed. bandaid and antibiotic ointment changes daily. Let us know if infection develops (streaking redness, pus).

## 2015-03-09 NOTE — Progress Notes (Signed)
   BP 138/96 mmHg  Pulse 60  Temp(Src) 98.4 F (36.9 C) (Oral)  Wt 194 lb 4 oz (88.111 kg)   CC: skin procedure  Subjective:    Patient ID: Craig Wade, male    DOB: June 14, 1970, 44 y.o.   MRN: [id number]  HPI: JABIR HARTTER is a 44 y.o. male presenting on 03/09/2015 for Mole removal   Left posterior thigh with skin growth that has grown over the last year. Stuck on clothes, pulls on skin.   No other growths or skin tags. No changing moles on skin. No hyper or hypopigmented macules on skin.   Relevant past medical, surgical, family and social history reviewed and updated as indicated. Interim medical history since our last visit reviewed. Allergies and medications reviewed and updated. No current outpatient prescriptions on file prior to visit.   No current facility-administered medications on file prior to visit.    Review of Systems Per HPI unless specifically indicated in ROS section     Objective:    BP 138/96 mmHg  Pulse 60  Temp(Src) 98.4 F (36.9 C) (Oral)  Wt 194 lb 4 oz (88.111 kg)  Wt Readings from Last 3 Encounters:  03/09/15 194 lb 4 oz (88.111 kg)  02/16/15 188 lb 12 oz (85.616 kg)  09/27/13 198 lb 12 oz (90.152 kg)    Physical Exam  Constitutional: He appears well-developed and well-nourished. No distress.  Skin: Skin is warm.  2cm polypoid growth on stalk L upper posterior thigh  Nursing note and vitals reviewed.  Skin growth scissor excision: IC obtained and in chart. Area cleaned with alcohol pad. Anesthesia achieved with 1% lidocaine with epinephrine. Skin growth snipped off with sterile iris scissors. Hemostasis achieved with silver nitrate. Wound dressed with triple abx ointment and bandaid. Lesion sent for pathology.     Assessment & Plan:   Problem List Items Addressed This Visit    Skin lesion of left lower extremity    Anticipate benign neurofibroma. Lesion removed. After care instructions discussed. Await pathology .       Other  Visit Diagnoses    Abnormal skin growth    -  Primary    Relevant Orders    Dermatology pathology        Follow up plan: Return if symptoms worsen or fail to improve.

## 2015-03-09 NOTE — Assessment & Plan Note (Signed)
Anticipate benign neurofibroma. Lesion removed. After care instructions discussed. Await pathology .

## 2015-03-12 ENCOUNTER — Encounter: Payer: Self-pay | Admitting: *Deleted

## 2016-02-12 ENCOUNTER — Other Ambulatory Visit: Payer: Self-pay | Admitting: Family Medicine

## 2016-02-12 DIAGNOSIS — R7309 Other abnormal glucose: Secondary | ICD-10-CM

## 2016-02-12 DIAGNOSIS — E7800 Pure hypercholesterolemia, unspecified: Secondary | ICD-10-CM

## 2016-02-12 DIAGNOSIS — E041 Nontoxic single thyroid nodule: Secondary | ICD-10-CM

## 2016-02-13 ENCOUNTER — Other Ambulatory Visit: Payer: Managed Care, Other (non HMO)

## 2016-02-18 ENCOUNTER — Encounter: Payer: BLUE CROSS/BLUE SHIELD | Admitting: Family Medicine

## 2016-04-08 ENCOUNTER — Ambulatory Visit (INDEPENDENT_AMBULATORY_CARE_PROVIDER_SITE_OTHER): Payer: Managed Care, Other (non HMO) | Admitting: Family Medicine

## 2016-04-08 ENCOUNTER — Encounter: Payer: Self-pay | Admitting: Family Medicine

## 2016-04-08 VITALS — BP 120/80 | HR 72 | Temp 98.5°F | Ht 69.5 in | Wt 193.8 lb

## 2016-04-08 DIAGNOSIS — E7800 Pure hypercholesterolemia, unspecified: Secondary | ICD-10-CM | POA: Diagnosis not present

## 2016-04-08 DIAGNOSIS — E041 Nontoxic single thyroid nodule: Secondary | ICD-10-CM | POA: Diagnosis not present

## 2016-04-08 DIAGNOSIS — Z0000 Encounter for general adult medical examination without abnormal findings: Secondary | ICD-10-CM | POA: Diagnosis not present

## 2016-04-08 DIAGNOSIS — R7309 Other abnormal glucose: Secondary | ICD-10-CM | POA: Diagnosis not present

## 2016-04-08 LAB — COMPREHENSIVE METABOLIC PANEL
ALK PHOS: 68 U/L (ref 39–117)
ALT: 43 U/L (ref 0–53)
AST: 25 U/L (ref 0–37)
Albumin: 4.8 g/dL (ref 3.5–5.2)
BUN: 15 mg/dL (ref 6–23)
CO2: 28 meq/L (ref 19–32)
Calcium: 9.5 mg/dL (ref 8.4–10.5)
Chloride: 103 mEq/L (ref 96–112)
Creatinine, Ser: 1.05 mg/dL (ref 0.40–1.50)
GFR: 81.01 mL/min (ref >60.00)
GLUCOSE: 108 mg/dL — AB (ref 70–99)
Potassium: 4.2 mEq/L (ref 3.5–5.1)
SODIUM: 138 meq/L (ref 135–145)
Total Bilirubin: 0.8 mg/dL (ref 0.2–1.2)
Total Protein: 7.6 g/dL (ref 6.0–8.3)

## 2016-04-08 LAB — HEMOGLOBIN A1C: Hgb A1c MFr Bld: 6.1 % (ref 4.6–6.5)

## 2016-04-08 NOTE — Progress Notes (Signed)
BP 120/80   Pulse 72   Temp 98.5 F (36.9 C) (Oral)   Ht 5' 9.5" (1.765 m)   Wt 193 lb 12 oz (87.9 kg)   BMI 28.20 kg/m    CC: CPE Subjective:    Patient ID: Craig Wade, male    DOB: 1970/06/25, 46 y.o.   MRN: [id number]  HPI: Craig Wade is a 46 y.o. male presenting on 04/08/2016 for Annual Exam   Preventative: Flu shot - declines Tdap 2016 Seat belt use discussed Sunscreen use discussed. Denies changing moles.  Non smoker Alcohol - very little  Caffeine: 1 cup/day Lives with wife, 2 children, 1 dog  Occupation: Arboriculturist tile  Activity: no regular exercise  Diet: good water, some fruits/vegetables   Relevant past medical, surgical, family and social history reviewed and updated as indicated. Interim medical history since our last visit reviewed. Allergies and medications reviewed and updated. No current outpatient prescriptions on file prior to visit.   No current facility-administered medications on file prior to visit.     Review of Systems  Constitutional: Negative for activity change, appetite change, chills, fatigue, fever and unexpected weight change.  HENT: Negative for hearing loss.   Eyes: Negative for visual disturbance.  Respiratory: Negative for cough, chest tightness, shortness of breath and wheezing.   Cardiovascular: Negative for chest pain, palpitations and leg swelling.  Gastrointestinal: Negative for abdominal distention, abdominal pain, blood in stool, constipation, diarrhea, nausea and vomiting.  Genitourinary: Negative for difficulty urinating and hematuria.  Musculoskeletal: Negative for arthralgias, myalgias and neck pain.  Skin: Negative for rash.  Neurological: Positive for headaches. Negative for dizziness, seizures and syncope.  Hematological: Negative for adenopathy. Does not bruise/bleed easily.  Psychiatric/Behavioral: Negative for dysphoric mood. The patient is not nervous/anxious.    Per HPI unless specifically  indicated in ROS section     Objective:    BP 120/80   Pulse 72   Temp 98.5 F (36.9 C) (Oral)   Ht 5' 9.5" (1.765 m)   Wt 193 lb 12 oz (87.9 kg)   BMI 28.20 kg/m   Wt Readings from Last 3 Encounters:  04/08/16 193 lb 12 oz (87.9 kg)  03/09/15 194 lb 4 oz (88.1 kg)  02/16/15 188 lb 12 oz (85.6 kg)    Physical Exam  Constitutional: He is oriented to person, place, and time. He appears well-developed and well-nourished. No distress.  HENT:  Head: Normocephalic and atraumatic.  Right Ear: Hearing, tympanic membrane, external ear and ear canal normal.  Left Ear: Hearing, tympanic membrane, external ear and ear canal normal.  Nose: Nose normal.  Mouth/Throat: Uvula is midline, oropharynx is clear and moist and mucous membranes are normal. No oropharyngeal exudate, posterior oropharyngeal edema or posterior oropharyngeal erythema.  Eyes: Conjunctivae and EOM are normal. Pupils are equal, round, and reactive to light. No scleral icterus.  Neck: Normal range of motion. Neck supple. No thyromegaly present.  Cardiovascular: Normal rate, regular rhythm, normal heart sounds and intact distal pulses.   No murmur heard. Pulses:      Radial pulses are 2+ on the right side, and 2+ on the left side.  Pulmonary/Chest: Effort normal and breath sounds normal. No respiratory distress. He has no wheezes. He has no rales.  Abdominal: Soft. Bowel sounds are normal. He exhibits no distension and no mass. There is no tenderness. There is no rebound and no guarding.  Musculoskeletal: Normal range of motion. He exhibits no edema.  Lymphadenopathy:  He has no cervical adenopathy.  Neurological: He is alert and oriented to person, place, and time.  CN grossly intact, station and gait intact  Skin: Skin is warm and dry. No rash noted.  Psychiatric: He has a normal mood and affect. His behavior is normal. Judgment and thought content normal.  Nursing note and vitals reviewed.  Results for orders placed  or performed in visit on 02/08/15  Lipid panel  Result Value Ref Range   Cholesterol 167 0 - 200 mg/dL   Triglycerides 130.0 0.0 - 149.0 mg/dL   HDL 39.80 >39.00 mg/dL   VLDL 26.0 0.0 - 40.0 mg/dL   LDL Cholesterol 101 (H) 0 - 99 mg/dL   Total CHOL/HDL Ratio 4    NonHDL 123456   Basic metabolic panel  Result Value Ref Range   Sodium 139 135 - 145 mEq/L   Potassium 4.4 3.5 - 5.1 mEq/L   Chloride 104 96 - 112 mEq/L   CO2 27 19 - 32 mEq/L   Glucose, Bld 116 (H) 70 - 99 mg/dL   BUN 15 6 - 23 mg/dL   Creatinine, Ser 1.00 0.40 - 1.50 mg/dL   Calcium 9.6 8.4 - 10.5 mg/dL   GFR 86.16 >60.00 mL/min      Assessment & Plan:   Problem List Items Addressed This Visit    Health maintenance examination - Primary    Preventative protocols reviewed and updated unless pt declined. Discussed healthy diet and lifestyle.       HYPERCHOLESTEROLEMIA    Chronic, minimal off meds.       Relevant Orders   Comprehensive metabolic panel   HYPERGLYCEMIA    Discussed healthy diet.  Update A1c today.       Relevant Orders   Comprehensive metabolic panel   Hemoglobin A1c   RESOLVED: Left thyroid nodule       Follow up plan: Return in about 1 year (around 04/08/2017) for annual exam, prior fasting for blood work.  Ria Bush, MD

## 2016-04-08 NOTE — Progress Notes (Signed)
Pre visit review using our clinic review tool, if applicable. No additional management support is needed unless otherwise documented below in the visit note. 

## 2016-04-08 NOTE — Patient Instructions (Addendum)
Labs today. You are doing well today.  Work on increased walking daily for exercise.  Return as needed or in 1 year for next physical.   Health Maintenance, Male A healthy lifestyle and preventative care can promote health and wellness.  Maintain regular health, dental, and eye exams.  Eat a healthy diet. Foods like vegetables, fruits, whole grains, low-fat dairy products, and lean protein foods contain the nutrients you need and are low in calories. Decrease your intake of foods high in solid fats, added sugars, and salt. Get information about a proper diet from your health care provider, if necessary.  Regular physical exercise is one of the most important things you can do for your health. Most adults should get at least 150 minutes of moderate-intensity exercise (any activity that increases your heart rate and causes you to sweat) each week. In addition, most adults need muscle-strengthening exercises on 2 or more days a week.   Maintain a healthy weight. The body mass index (BMI) is a screening tool to identify possible weight problems. It provides an estimate of body fat based on height and weight. Your health care provider can find your BMI and can help you achieve or maintain a healthy weight. For males 20 years and older:  A BMI below 18.5 is considered underweight.  A BMI of 18.5 to 24.9 is normal.  A BMI of 25 to 29.9 is considered overweight.  A BMI of 30 and above is considered obese.  Maintain normal blood lipids and cholesterol by exercising and minimizing your intake of saturated fat. Eat a balanced diet with plenty of fruits and vegetables. Blood tests for lipids and cholesterol should begin at age 87 and be repeated every 5 years. If your lipid or cholesterol levels are high, you are over age 64, or you are at high risk for heart disease, you may need your cholesterol levels checked more frequently.Ongoing high lipid and cholesterol levels should be treated with medicines  if diet and exercise are not working.  If you smoke, find out from your health care provider how to quit. If you do not use tobacco, do not start.  Lung cancer screening is recommended for adults aged 83-80 years who are at high risk for developing lung cancer because of a history of smoking. A yearly low-dose CT scan of the lungs is recommended for people who have at least a 30-pack-year history of smoking and are current smokers or have quit within the past 15 years. A pack year of smoking is smoking an average of 1 pack of cigarettes a day for 1 year (for example, a 30-pack-year history of smoking could mean smoking 1 pack a day for 30 years or 2 packs a day for 15 years). Yearly screening should continue until the smoker has stopped smoking for at least 15 years. Yearly screening should be stopped for people who develop a health problem that would prevent them from having lung cancer treatment.  If you choose to drink alcohol, do not have more than 2 drinks per day. One drink is considered to be 12 oz (360 mL) of beer, 5 oz (150 mL) of wine, or 1.5 oz (45 mL) of liquor.  Avoid the use of street drugs. Do not share needles with anyone. Ask for help if you need support or instructions about stopping the use of drugs.  High blood pressure causes heart disease and increases the risk of stroke. High blood pressure is more likely to develop in:  People  who have blood pressure in the end of the normal range (100-139/85-89 mm Hg).  People who are overweight or obese.  People who are African American.  If you are 17-81 years of age, have your blood pressure checked every 3-5 years. If you are 41 years of age or older, have your blood pressure checked every year. You should have your blood pressure measured twice-once when you are at a hospital or clinic, and once when you are not at a hospital or clinic. Record the average of the two measurements. To check your blood pressure when you are not at a  hospital or clinic, you can use:  An automated blood pressure machine at a pharmacy.  A home blood pressure monitor.  If you are 22-15 years old, ask your health care provider if you should take aspirin to prevent heart disease.  Diabetes screening involves taking a blood sample to check your fasting blood sugar level. This should be done once every 3 years after age 59 if you are at a normal weight and without risk factors for diabetes. Testing should be considered at a younger age or be carried out more frequently if you are overweight and have at least 1 risk factor for diabetes.  Colorectal cancer can be detected and often prevented. Most routine colorectal cancer screening begins at the age of 60 and continues through age 66. However, your health care provider may recommend screening at an earlier age if you have risk factors for colon cancer. On a yearly basis, your health care provider may provide home test kits to check for hidden blood in the stool. A small camera at the end of a tube may be used to directly examine the colon (sigmoidoscopy or colonoscopy) to detect the earliest forms of colorectal cancer. Talk to your health care provider about this at age 62 when routine screening begins. A direct exam of the colon should be repeated every 5-10 years through age 81, unless early forms of precancerous polyps or small growths are found.  People who are at an increased risk for hepatitis B should be screened for this virus. You are considered at high risk for hepatitis B if:  You were born in a country where hepatitis B occurs often. Talk with your health care provider about which countries are considered high risk.  Your parents were born in a high-risk country and you have not received a shot to protect against hepatitis B (hepatitis B vaccine).  You have HIV or AIDS.  You use needles to inject street drugs.  You live with, or have sex with, someone who has hepatitis B.  You are a  man who has sex with other men (MSM).  You get hemodialysis treatment.  You take certain medicines for conditions like cancer, organ transplantation, and autoimmune conditions.  Hepatitis C blood testing is recommended for all people born from [birth year] through 1965 and any individual with known risk factors for hepatitis C.  Healthy men should no longer receive prostate-specific antigen (PSA) blood tests as part of routine cancer screening. Talk to your health care provider about prostate cancer screening.  Testicular cancer screening is not recommended for adolescents or adult males who have no symptoms. Screening includes self-exam, a health care provider exam, and other screening tests. Consult with your health care provider about any symptoms you have or any concerns you have about testicular cancer.  Practice safe sex. Use condoms and avoid high-risk sexual practices to reduce the spread of sexually  transmitted infections (STIs).  You should be screened for STIs, including gonorrhea and chlamydia if:  You are sexually active and are younger than 24 years.  You are older than 24 years, and your health care provider tells you that you are at risk for this type of infection.  Your sexual activity has changed since you were last screened, and you are at an increased risk for chlamydia or gonorrhea. Ask your health care provider if you are at risk.  If you are at risk of being infected with HIV, it is recommended that you take a prescription medicine daily to prevent HIV infection. This is called pre-exposure prophylaxis (PrEP). You are considered at risk if:  You are a man who has sex with other men (MSM).  You are a heterosexual man who is sexually active with multiple partners.  You take drugs by injection.  You are sexually active with a partner who has HIV.  Talk with your health care provider about whether you are at high risk of being infected with HIV. If you choose to begin PrEP,  you should first be tested for HIV. You should then be tested every 3 months for as long as you are taking PrEP.  Use sunscreen. Apply sunscreen liberally and repeatedly throughout the day. You should seek shade when your shadow is shorter than you. Protect yourself by wearing long sleeves, pants, a wide-brimmed hat, and sunglasses year round whenever you are outdoors.  Tell your health care provider of new moles or changes in moles, especially if there is a change in shape or color. Also, tell your health care provider if a mole is larger than the size of a pencil eraser.  A one-time screening for abdominal aortic aneurysm (AAA) and surgical repair of large AAAs by ultrasound is recommended for men aged 3-75 years who are current or former smokers.  Stay current with your vaccines (immunizations). This information is not intended to replace advice given to you by your health care provider. Make sure you discuss any questions you have with your health care provider. Document Released: 09/13/2007 Document Revised: 04/07/2014 Document Reviewed: 12/19/2014 Elsevier Interactive Patient Education  2017 [employer].

## 2016-04-08 NOTE — Assessment & Plan Note (Signed)
Chronic, minimal off meds.

## 2016-04-08 NOTE — Assessment & Plan Note (Signed)
Preventative protocols reviewed and updated unless pt declined. Discussed healthy diet and lifestyle.  

## 2016-04-08 NOTE — Assessment & Plan Note (Addendum)
Discussed healthy diet.  Update A1c today.

## 2016-04-14 ENCOUNTER — Encounter: Payer: Self-pay | Admitting: *Deleted

## 2017-04-08 ENCOUNTER — Other Ambulatory Visit: Payer: Self-pay | Admitting: Family Medicine

## 2017-04-08 ENCOUNTER — Encounter: Payer: Self-pay | Admitting: Family Medicine

## 2017-04-08 DIAGNOSIS — E7800 Pure hypercholesterolemia, unspecified: Secondary | ICD-10-CM

## 2017-04-08 DIAGNOSIS — R7309 Other abnormal glucose: Secondary | ICD-10-CM

## 2017-04-09 ENCOUNTER — Other Ambulatory Visit (INDEPENDENT_AMBULATORY_CARE_PROVIDER_SITE_OTHER): Payer: 59

## 2017-04-09 DIAGNOSIS — E7800 Pure hypercholesterolemia, unspecified: Secondary | ICD-10-CM | POA: Diagnosis not present

## 2017-04-09 DIAGNOSIS — R7309 Other abnormal glucose: Secondary | ICD-10-CM

## 2017-04-09 LAB — COMPREHENSIVE METABOLIC PANEL
ALT: 31 U/L (ref 0–53)
AST: 21 U/L (ref 0–37)
Albumin: 4.7 g/dL (ref 3.5–5.2)
Alkaline Phosphatase: 58 U/L (ref 39–117)
BILIRUBIN TOTAL: 1 mg/dL (ref 0.2–1.2)
BUN: 13 mg/dL (ref 6–23)
CALCIUM: 9.2 mg/dL (ref 8.4–10.5)
CHLORIDE: 105 meq/L (ref 96–112)
CO2: 29 meq/L (ref 19–32)
Creatinine, Ser: 1 mg/dL (ref 0.40–1.50)
GFR: 85.33 mL/min (ref >60.00)
GLUCOSE: 116 mg/dL — AB (ref 70–99)
POTASSIUM: 4.4 meq/L (ref 3.5–5.1)
Sodium: 141 mEq/L (ref 135–145)
Total Protein: 7.2 g/dL (ref 6.0–8.3)

## 2017-04-09 LAB — LIPID PANEL
CHOL/HDL RATIO: 4
Cholesterol: 153 mg/dL (ref 0–200)
HDL: 38.8 mg/dL — AB (ref >39.00)
LDL CALC: 94 mg/dL (ref 0–99)
NonHDL: 114.16
TRIGLYCERIDES: 102 mg/dL (ref 0.0–149.0)
VLDL: 20.4 mg/dL (ref 0.0–40.0)

## 2017-04-09 LAB — HEMOGLOBIN A1C: HEMOGLOBIN A1C: 6.2 % (ref 4.6–6.5)

## 2017-04-13 ENCOUNTER — Encounter: Payer: Self-pay | Admitting: Family Medicine

## 2017-04-13 ENCOUNTER — Ambulatory Visit (INDEPENDENT_AMBULATORY_CARE_PROVIDER_SITE_OTHER): Payer: 59 | Admitting: Family Medicine

## 2017-04-13 VITALS — BP 122/82 | HR 72 | Temp 98.2°F | Ht 69.5 in | Wt 196.5 lb

## 2017-04-13 DIAGNOSIS — Z0000 Encounter for general adult medical examination without abnormal findings: Secondary | ICD-10-CM | POA: Diagnosis not present

## 2017-04-13 DIAGNOSIS — G8929 Other chronic pain: Secondary | ICD-10-CM | POA: Diagnosis not present

## 2017-04-13 DIAGNOSIS — E7800 Pure hypercholesterolemia, unspecified: Secondary | ICD-10-CM

## 2017-04-13 DIAGNOSIS — R7303 Prediabetes: Secondary | ICD-10-CM

## 2017-04-13 DIAGNOSIS — M25562 Pain in left knee: Secondary | ICD-10-CM | POA: Insufficient documentation

## 2017-04-13 NOTE — Progress Notes (Signed)
BP 122/82 (BP Location: Left Arm, Patient Position: Sitting, Cuff Size: Normal)   Pulse 72   Temp 98.2 F (36.8 C) (Oral)   Ht 5' 9.5" (1.765 m)   Wt 196 lb 8 oz (89.1 kg)   SpO2 99%   BMI 28.60 kg/m    CC: CPE Subjective:    Patient ID: Craig Wade, male    DOB: [birth year], 47 y.o.   MRN: [id number]  HPI: Craig Wade is a 47 y.o. male presenting on 04/13/2017 for Annual Exam   Preventative: Flu shot - declines Tdap 2016 Seat belt use discussed Sunscreen use discussed. Denies changing moles.  Non smoker Alcohol - none  Caffeine: 1 cup/day Lives with wife, 2 children, 1 dog  Occupation: Arboriculturist tile  Activity: no regular exercise  Diet: good water, some fruits/vegetables   Relevant past medical, surgical, family and social history reviewed and updated as indicated. Interim medical history since our last visit reviewed. Allergies and medications reviewed and updated. No outpatient medications prior to visit.   No facility-administered medications prior to visit.      Per HPI unless specifically indicated in ROS section below Review of Systems  Constitutional: Negative for activity change, appetite change, chills, fatigue, fever and unexpected weight change.  HENT: Negative for hearing loss.   Eyes: Negative for visual disturbance.  Respiratory: Negative for cough, chest tightness, shortness of breath and wheezing.   Cardiovascular: Negative for chest pain, palpitations and leg swelling.  Gastrointestinal: Negative for abdominal distention, abdominal pain, blood in stool, constipation, diarrhea, nausea and vomiting.  Genitourinary: Negative for difficulty urinating and hematuria.  Musculoskeletal: Negative for arthralgias, myalgias and neck pain.  Skin: Negative for rash.  Neurological: Negative for dizziness, seizures, syncope and headaches.  Hematological: Negative for adenopathy. Does not bruise/bleed easily.  Psychiatric/Behavioral: Negative for  dysphoric mood. The patient is not nervous/anxious.        Objective:    BP 122/82 (BP Location: Left Arm, Patient Position: Sitting, Cuff Size: Normal)   Pulse 72   Temp 98.2 F (36.8 C) (Oral)   Ht 5' 9.5" (1.765 m)   Wt 196 lb 8 oz (89.1 kg)   SpO2 99%   BMI 28.60 kg/m   Wt Readings from Last 3 Encounters:  04/13/17 196 lb 8 oz (89.1 kg)  04/08/16 193 lb 12 oz (87.9 kg)  03/09/15 194 lb 4 oz (88.1 kg)    Physical Exam  Constitutional: He is oriented to person, place, and time. He appears well-developed and well-nourished. No distress.  HENT:  Head: Normocephalic and atraumatic.  Right Ear: Hearing, tympanic membrane, external ear and ear canal normal.  Left Ear: Hearing, tympanic membrane, external ear and ear canal normal.  Nose: Nose normal.  Mouth/Throat: Uvula is midline, oropharynx is clear and moist and mucous membranes are normal. No oropharyngeal exudate, posterior oropharyngeal edema or posterior oropharyngeal erythema.  Eyes: Conjunctivae and EOM are normal. Pupils are equal, round, and reactive to light. No scleral icterus.  Neck: Normal range of motion. Neck supple. No thyromegaly present.  Cardiovascular: Normal rate, regular rhythm, normal heart sounds and intact distal pulses.  No murmur heard. Pulses:      Radial pulses are 2+ on the right side, and 2+ on the left side.  Pulmonary/Chest: Effort normal and breath sounds normal. No respiratory distress. He has no wheezes. He has no rales.  Abdominal: Soft. Bowel sounds are normal. He exhibits no distension and no mass. There is no tenderness.  There is no rebound and no guarding.  Musculoskeletal: Normal range of motion. He exhibits no edema.  Lymphadenopathy:    He has no cervical adenopathy.  Neurological: He is alert and oriented to person, place, and time.  CN grossly intact, station and gait intact  Skin: Skin is warm and dry. No rash noted.  Psychiatric: He has a normal mood and affect. His behavior is  normal. Judgment and thought content normal.  Nursing note and vitals reviewed.  Results for orders placed or performed in visit on 04/09/17  Hemoglobin A1c  Result Value Ref Range   Hgb A1c MFr Bld 6.2 4.6 - 6.5 %  Comprehensive metabolic panel  Result Value Ref Range   Sodium 141 135 - 145 mEq/L   Potassium 4.4 3.5 - 5.1 mEq/L   Chloride 105 96 - 112 mEq/L   CO2 29 19 - 32 mEq/L   Glucose, Bld 116 (H) 70 - 99 mg/dL   BUN 13 6 - 23 mg/dL   Creatinine, Ser 1.00 0.40 - 1.50 mg/dL   Total Bilirubin 1.0 0.2 - 1.2 mg/dL   Alkaline Phosphatase 58 39 - 117 U/L   AST 21 0 - 37 U/L   ALT 31 0 - 53 U/L   Total Protein 7.2 6.0 - 8.3 g/dL   Albumin 4.7 3.5 - 5.2 g/dL   Calcium 9.2 8.4 - 10.5 mg/dL   GFR 85.33 >60.00 mL/min  Lipid panel  Result Value Ref Range   Cholesterol 153 0 - 200 mg/dL   Triglycerides 102.0 0.0 - 149.0 mg/dL   HDL 38.80 (L) >39.00 mg/dL   VLDL 20.4 0.0 - 40.0 mg/dL   LDL Cholesterol 94 0 - 99 mg/dL   Total CHOL/HDL Ratio 4    NonHDL 114.16       Assessment & Plan:   Problem List Items Addressed This Visit    Health maintenance examination - Primary    Preventative protocols reviewed and updated unless pt declined. Discussed healthy diet and lifestyle.       HYPERCHOLESTEROLEMIA    Chronic, off medications. The 10-year ASCVD risk score Mikey Bussing DC Brooke Bonito., et al., 2013) is: 1.8%   Values used to calculate the score:     Age: 31 years     Sex: Male     Is Non-Hispanic African American: No     Diabetic: No     Tobacco smoker: No     Systolic Blood Pressure: 621 mmHg     Is BP treated: No     HDL Cholesterol: 38.8 mg/dL     Total Cholesterol: 153 mg/dL       Left knee pain    Off and on L medial knee pain that started after playing basketball. + mcmurray's. Encouraged supportive care of rest, knee brace, update if worsening for further evaluation.       Prediabetes    Encouraged avoiding added sugars in diet.           Follow up plan: Return in  about 1 year (around 04/13/2018) for annual exam, prior fasting for blood work.  Ria Bush, MD

## 2017-04-13 NOTE — Patient Instructions (Addendum)
You are doing well today Return as needed or in 1 year for next physical. Watch added sugars and simple carbs for prediabetes  Health Maintenance, Male A healthy lifestyle and preventive care is important for your health and wellness. Ask your health care provider about what schedule of regular examinations is right for you. What should I know about weight and diet? Eat a Healthy Diet  Eat plenty of vegetables, fruits, whole grains, low-fat dairy products, and lean protein.  Do not eat a lot of foods high in solid fats, added sugars, or salt.  Maintain a Healthy Weight Regular exercise can help you achieve or maintain a healthy weight. You should:  Do at least 150 minutes of exercise each week. The exercise should increase your heart rate and make you sweat (moderate-intensity exercise).  Do strength-training exercises at least twice a week.  Watch Your Levels of Cholesterol and Blood Lipids  Have your blood tested for lipids and cholesterol every 5 years starting at 47 years of age. If you are at high risk for heart disease, you should start having your blood tested when you are 47 years old. You may need to have your cholesterol levels checked more often if: ? Your lipid or cholesterol levels are high. ? You are older than 47 years of age. ? You are at high risk for heart disease.  What should I know about cancer screening? Many types of cancers can be detected early and may often be prevented. Lung Cancer  You should be screened every year for lung cancer if: ? You are a current smoker who has smoked for at least 30 years. ? You are a former smoker who has quit within the past 15 years.  Talk to your health care provider about your screening options, when you should start screening, and how often you should be screened.  Colorectal Cancer  Routine colorectal cancer screening usually begins at 47 years of age and should be repeated every 5-10 years until you are 47 years old.  You may need to be screened more often if early forms of precancerous polyps or small growths are found. Your health care provider may recommend screening at an earlier age if you have risk factors for colon cancer.  Your health care provider may recommend using home test kits to check for hidden blood in the stool.  A small camera at the end of a tube can be used to examine your colon (sigmoidoscopy or colonoscopy). This checks for the earliest forms of colorectal cancer.  Prostate and Testicular Cancer  Depending on your age and overall health, your health care provider may do certain tests to screen for prostate and testicular cancer.  Talk to your health care provider about any symptoms or concerns you have about testicular or prostate cancer.  Skin Cancer  Check your skin from head to toe regularly.  Tell your health care provider about any new moles or changes in moles, especially if: ? There is a change in a mole's size, shape, or color. ? You have a mole that is larger than a pencil eraser.  Always use sunscreen. Apply sunscreen liberally and repeat throughout the day.  Protect yourself by wearing long sleeves, pants, a wide-brimmed hat, and sunglasses when outside.  What should I know about heart disease, diabetes, and high blood pressure?  If you are 18-41 years of age, have your blood pressure checked every 3-5 years. If you are 67 years of age or older, have your  blood pressure checked every year. You should have your blood pressure measured twice-once when you are at a hospital or clinic, and once when you are not at a hospital or clinic. Record the average of the two measurements. To check your blood pressure when you are not at a hospital or clinic, you can use: ? An automated blood pressure machine at a pharmacy. ? A home blood pressure monitor.  Talk to your health care provider about your target blood pressure.  If you are between 59-48 years old, ask your health  care provider if you should take aspirin to prevent heart disease.  Have regular diabetes screenings by checking your fasting blood sugar level. ? If you are at a normal weight and have a low risk for diabetes, have this test once every three years after the age of 63. ? If you are overweight and have a high risk for diabetes, consider being tested at a younger age or more often.  A one-time screening for abdominal aortic aneurysm (AAA) by ultrasound is recommended for men aged 35-75 years who are current or former smokers. What should I know about preventing infection? Hepatitis B If you have a higher risk for hepatitis B, you should be screened for this virus. Talk with your health care provider to find out if you are at risk for hepatitis B infection. Hepatitis C Blood testing is recommended for:  Everyone born from [birth year] through 1965.  Anyone with known risk factors for hepatitis C.  Sexually Transmitted Diseases (STDs)  You should be screened each year for STDs including gonorrhea and chlamydia if: ? You are sexually active and are younger than 47 years of age. ? You are older than 47 years of age and your health care provider tells you that you are at risk for this type of infection. ? Your sexual activity has changed since you were last screened and you are at an increased risk for chlamydia or gonorrhea. Ask your health care provider if you are at risk.  Talk with your health care provider about whether you are at high risk of being infected with HIV. Your health care provider may recommend a prescription medicine to help prevent HIV infection.  What else can I do?  Schedule regular health, dental, and eye exams.  Stay current with your vaccines (immunizations).  Do not use any tobacco products, such as cigarettes, chewing tobacco, and e-cigarettes. If you need help quitting, ask your health care provider.  Limit alcohol intake to no more than 2 drinks per day. One drink  equals 12 ounces of beer, 5 ounces of wine, or 1 ounces of hard liquor.  Do not use street drugs.  Do not share needles.  Ask your health care provider for help if you need support or information about quitting drugs.  Tell your health care provider if you often feel depressed.  Tell your health care provider if you have ever been abused or do not feel safe at home. This information is not intended to replace advice given to you by your health care provider. Make sure you discuss any questions you have with your health care provider. Document Released: 09/13/2007 Document Revised: 11/14/2015 Document Reviewed: 12/19/2014 Elsevier Interactive Patient Education  [employer].

## 2017-04-13 NOTE — Assessment & Plan Note (Signed)
Off and on L medial knee pain that started after playing basketball. + mcmurray's. Encouraged supportive care of rest, knee brace, update if worsening for further evaluation.

## 2017-04-13 NOTE — Assessment & Plan Note (Signed)
Chronic, off medications. The 10-year ASCVD risk score Mikey Bussing DC Brooke Bonito., et al., 2013) is: 1.8%   Values used to calculate the score:     Age: 47 years     Sex: Male     Is Non-Hispanic African American: No     Diabetic: No     Tobacco smoker: No     Systolic Blood Pressure: 128 mmHg     Is BP treated: No     HDL Cholesterol: 38.8 mg/dL     Total Cholesterol: 153 mg/dL

## 2017-04-13 NOTE — Assessment & Plan Note (Signed)
Preventative protocols reviewed and updated unless pt declined. Discussed healthy diet and lifestyle.  

## 2017-04-13 NOTE — Assessment & Plan Note (Signed)
Encouraged avoiding added sugars in diet.  

## 2017-11-20 IMAGING — US US SOFT TISSUE HEAD/NECK
1 series · 14 of 25 positions shown · non-contrast
Comparison: None.

CLINICAL DATA: 44-year-old male with a history of left-sided
thyroid nodule

EXAM:
THYROID ULTRASOUND
TECHNIQUE: Ultrasound examination of the thyroid gland and adjacent soft
tissues was performed.

[Series 1: us soft tissue head/neck · 0.07mm/px · 14 of 37 slices shown]
[im 1/37]
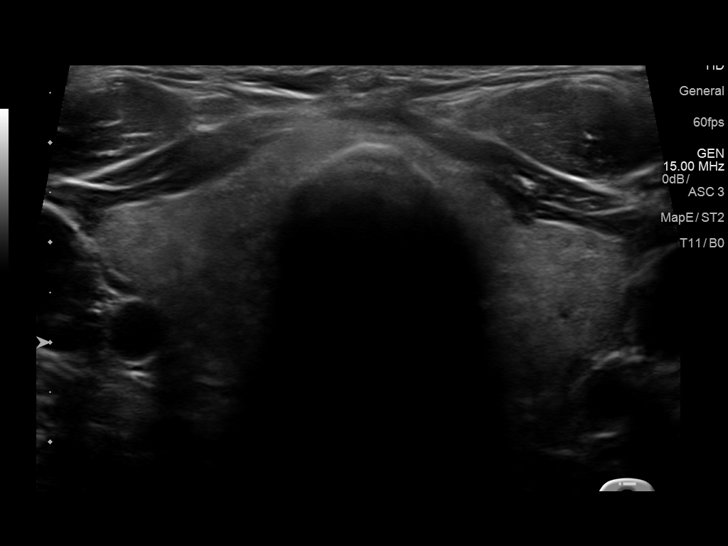
[im 4/37]
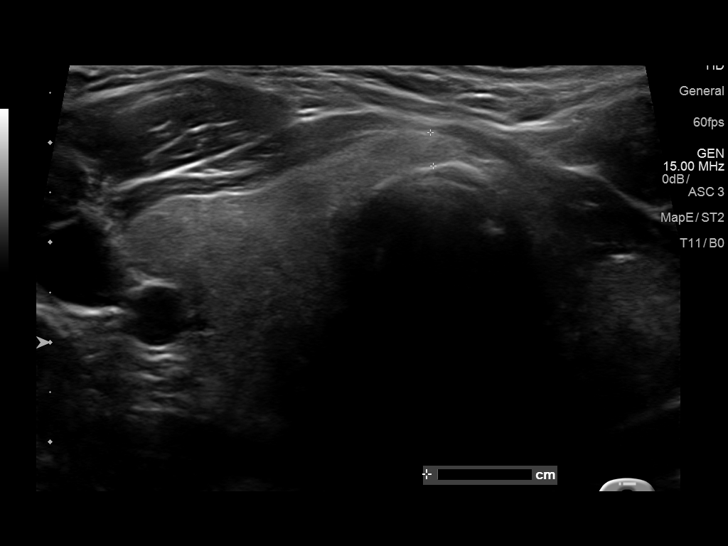
[im 7/37]
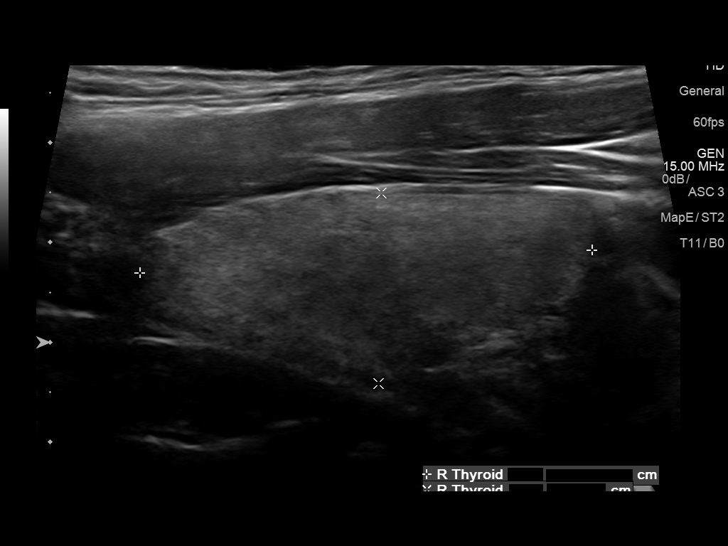
[im 10/37]
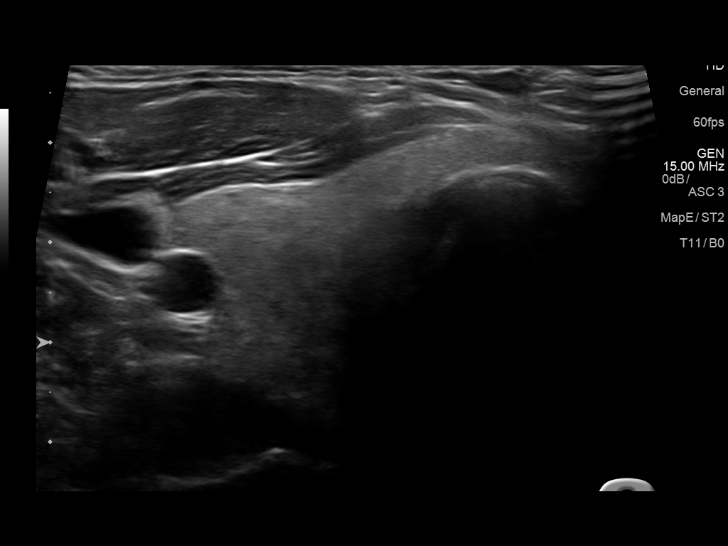
[im 13/37]
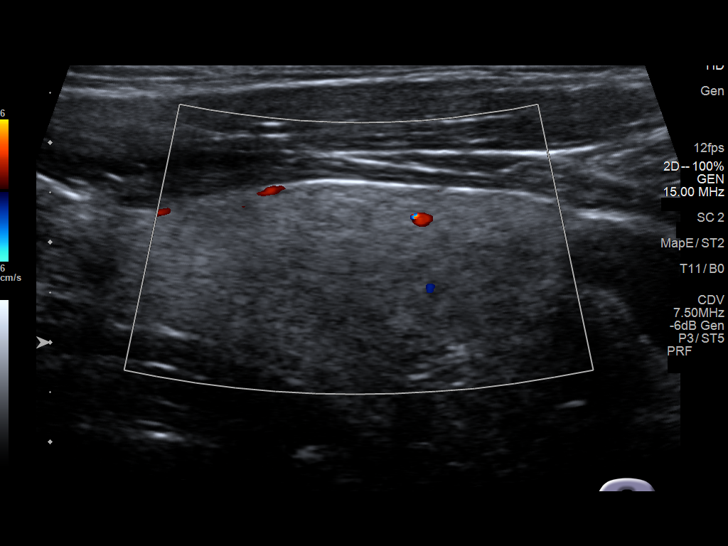
[im 14/37]
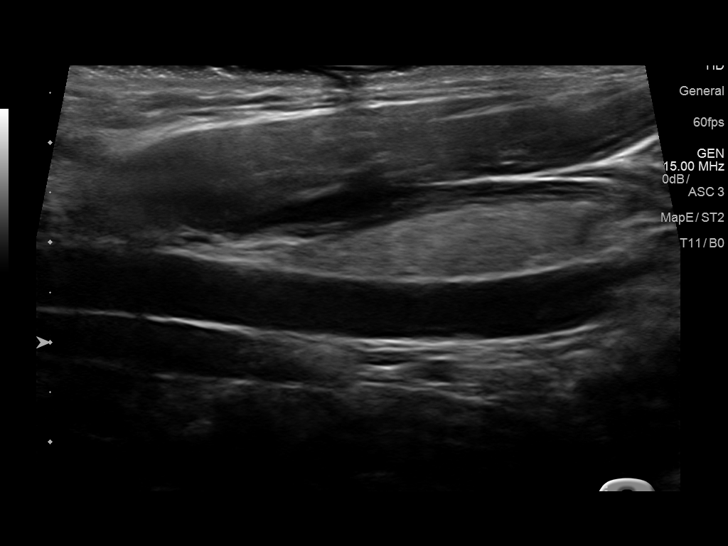
[im 17/37]
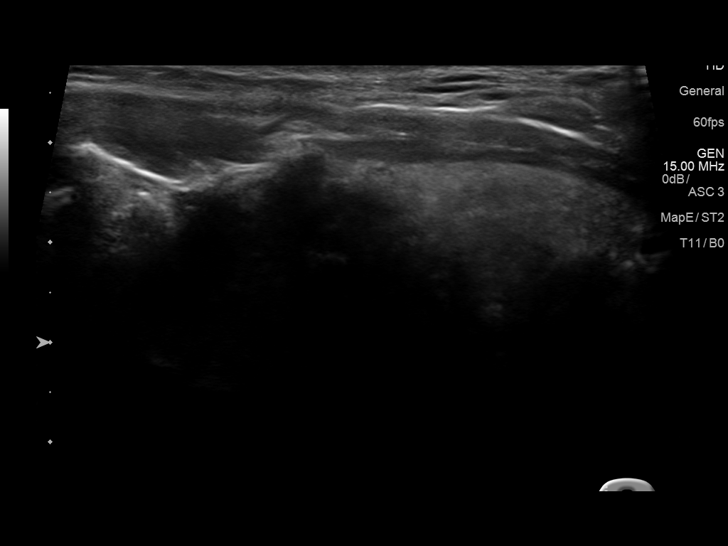
[im 20/37]
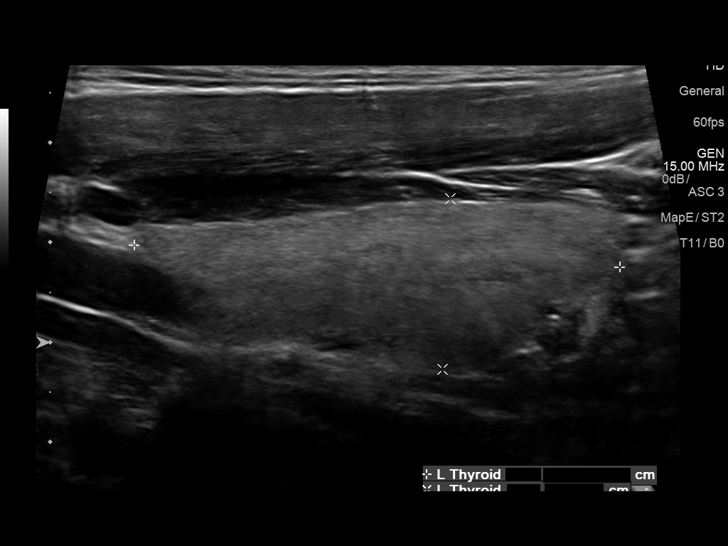
[im 23/37]
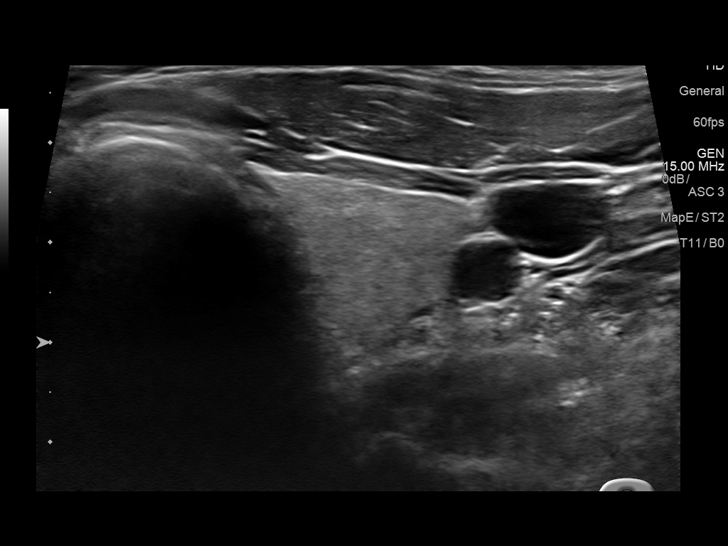
[im 25/37]
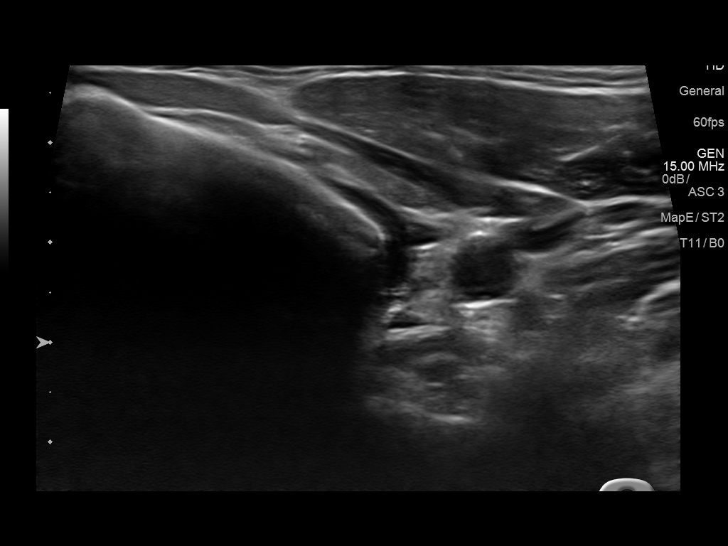
[im 28/37]
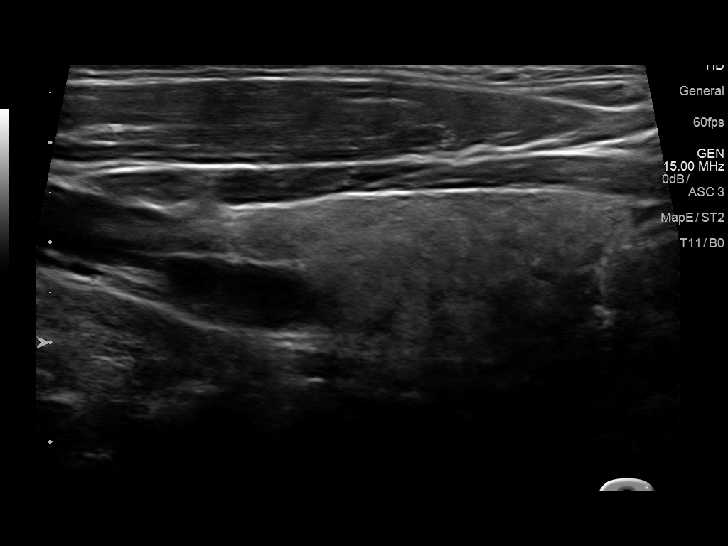
[im 31/37]
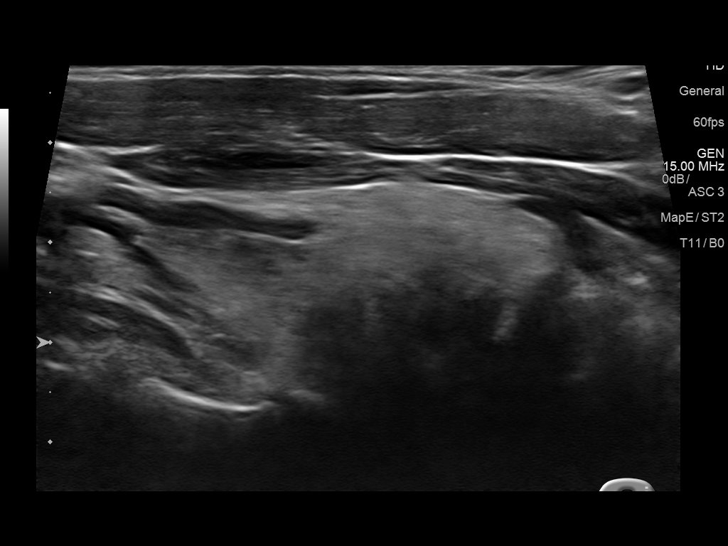
[im 34/37]
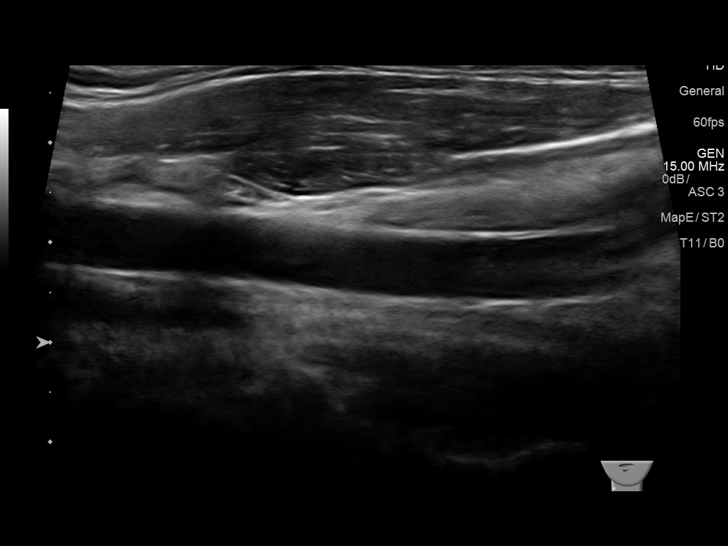
[im 37/37]
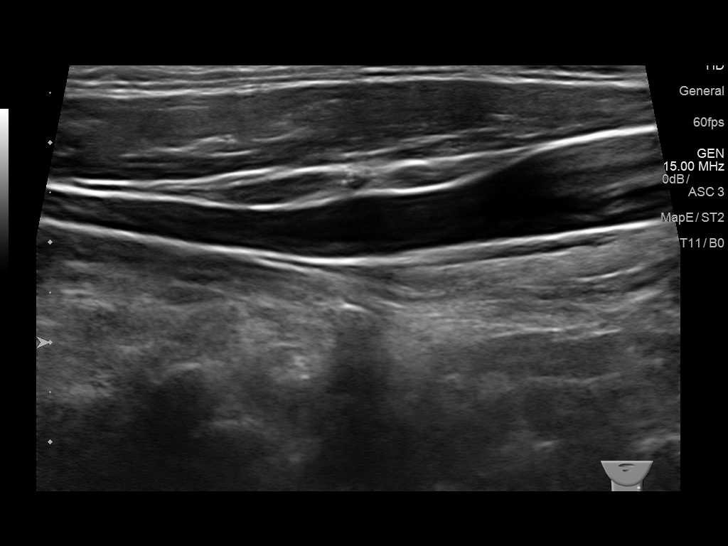

[14 of 25 positions shown; findings below may reference images not displayed]

FINDINGS: Right thyroid lobe

Measurements: 4.5 cm x 1.9 cm x 1.9 cm. Heterogeneous appearance of
the thyroid without significantly increased flow.

Left thyroid lobe

Measurements: 4.9 cm x 1.7 cm x 1.7 cm. Heterogeneous appearance of
the thyroid without significantly increased flow.

Isthmus

Thickness: 3 mm.  No nodules visualized.

Lymphadenopathy

None visualized.
IMPRESSION: Heterogeneous appearance of the thyroid tissue, without
significantly increased flow, with no nodules identified.

## 2018-04-11 ENCOUNTER — Other Ambulatory Visit: Payer: Self-pay | Admitting: Family Medicine

## 2018-04-11 DIAGNOSIS — R7303 Prediabetes: Secondary | ICD-10-CM

## 2018-04-11 DIAGNOSIS — E7800 Pure hypercholesterolemia, unspecified: Secondary | ICD-10-CM

## 2018-04-13 ENCOUNTER — Other Ambulatory Visit (INDEPENDENT_AMBULATORY_CARE_PROVIDER_SITE_OTHER): Payer: Managed Care, Other (non HMO)

## 2018-04-13 DIAGNOSIS — E7800 Pure hypercholesterolemia, unspecified: Secondary | ICD-10-CM | POA: Diagnosis not present

## 2018-04-13 DIAGNOSIS — R7303 Prediabetes: Secondary | ICD-10-CM | POA: Diagnosis not present

## 2018-04-13 LAB — BASIC METABOLIC PANEL
BUN: 17 mg/dL (ref 6–23)
CO2: 26 mEq/L (ref 19–32)
Calcium: 9.3 mg/dL (ref 8.4–10.5)
Chloride: 105 mEq/L (ref 96–112)
Creatinine, Ser: 1.02 mg/dL (ref 0.40–1.50)
GFR: 83.04 mL/min (ref >60.00)
GLUCOSE: 119 mg/dL — AB (ref 70–99)
POTASSIUM: 4.2 meq/L (ref 3.5–5.1)
Sodium: 139 mEq/L (ref 135–145)

## 2018-04-13 LAB — LIPID PANEL
CHOL/HDL RATIO: 4
Cholesterol: 163 mg/dL (ref 0–200)
HDL: 38.6 mg/dL — AB (ref >39.00)
LDL CALC: 91 mg/dL (ref 0–99)
NONHDL: 124.49
Triglycerides: 166 mg/dL — ABNORMAL HIGH (ref 0.0–149.0)
VLDL: 33.2 mg/dL (ref 0.0–40.0)

## 2018-04-13 LAB — HEMOGLOBIN A1C: HEMOGLOBIN A1C: 6.1 % (ref 4.6–6.5)

## 2018-04-15 ENCOUNTER — Encounter: Payer: Self-pay | Admitting: Family Medicine

## 2018-04-15 NOTE — Progress Notes (Signed)
BP 118/80 (BP Location: Left Arm, Patient Position: Sitting, Cuff Size: Normal)   Pulse 60   Temp 97.6 F (36.4 C) (Oral)   Ht 5\' 9"  (1.753 m)   Wt 198 lb 4 oz (89.9 kg)   SpO2 98%   BMI 29.28 kg/m    CC: CPE Subjective:    Patient ID: Craig Wade, male    DOB: [birth year], 48 y.o.   MRN: [id number]  HPI: Craig Wade is a 48 y.o. male presenting on 04/16/2018 for Annual Exam   Preventative: Flu shot - declines [id number] Seat beltuse discussed Sunscreen use discussed. Denies changing moles. Non smoker Alcohol - none Dentist overdue - encouraged he schedule Eye exam q2 yrs   Caffeine: 1 cup/day Lives with wife, 2 children, 1 dog  Occupation: Arboriculturist tile  Activity: no regular exercise  Diet: good water, some fruits/vegetables     Relevant past medical, surgical, family and social history reviewed and updated as indicated. Interim medical history since our last visit reviewed. Allergies and medications reviewed and updated. No outpatient medications prior to visit.   No facility-administered medications prior to visit.      Per HPI unless specifically indicated in ROS section below Review of Systems  Constitutional: Negative for activity change, appetite change, chills, fatigue, fever and unexpected weight change.  HENT: Negative for hearing loss.   Eyes: Negative for visual disturbance.  Respiratory: Negative for cough, chest tightness, shortness of breath and wheezing.   Cardiovascular: Negative for chest pain, palpitations and leg swelling.  Gastrointestinal: Negative for abdominal distention, abdominal pain, blood in stool, constipation, diarrhea, nausea and vomiting.  Genitourinary: Negative for difficulty urinating and hematuria.  Musculoskeletal: Negative for arthralgias, myalgias and neck pain.  Skin: Negative for rash.  Neurological: Negative for dizziness, seizures, syncope and headaches.  Hematological: Negative for adenopathy. Does not  bruise/bleed easily.  Psychiatric/Behavioral: Negative for dysphoric mood. The patient is not nervous/anxious.    Objective:    BP 118/80 (BP Location: Left Arm, Patient Position: Sitting, Cuff Size: Normal)   Pulse 60   Temp 97.6 F (36.4 C) (Oral)   Ht 5\' 9"  (1.753 m)   Wt 198 lb 4 oz (89.9 kg)   SpO2 98%   BMI 29.28 kg/m   Wt Readings from Last 3 Encounters:  04/16/18 198 lb 4 oz (89.9 kg)  04/13/17 196 lb 8 oz (89.1 kg)  04/08/16 193 lb 12 oz (87.9 kg)    Physical Exam Vitals signs and nursing note reviewed.  Constitutional:      General: He is not in acute distress.    Appearance: He is well-developed.  HENT:     Head: Normocephalic and atraumatic.     Right Ear: Hearing, tympanic membrane, ear canal and external ear normal.     Left Ear: Hearing, tympanic membrane, ear canal and external ear normal.     Nose: Nose normal.     Mouth/Throat:     Pharynx: Uvula midline. No oropharyngeal exudate or posterior oropharyngeal erythema.  Eyes:     General: No scleral icterus.    Conjunctiva/sclera: Conjunctivae normal.     Pupils: Pupils are equal, round, and reactive to light.  Neck:     Musculoskeletal: Normal range of motion and neck supple.  Cardiovascular:     Rate and Rhythm: Normal rate and regular rhythm.     Pulses:          Radial pulses are 2+ on the right side and 2+  on the left side.     Heart sounds: Normal heart sounds. No murmur.  Pulmonary:     Effort: Pulmonary effort is normal. No respiratory distress.     Breath sounds: Normal breath sounds. No wheezing or rales.  Abdominal:     General: Bowel sounds are normal. There is no distension.     Palpations: Abdomen is soft. There is no mass.     Tenderness: There is no abdominal tenderness. There is no guarding or rebound.  Musculoskeletal: Normal range of motion.  Lymphadenopathy:     Cervical: No cervical adenopathy.  Skin:    General: Skin is warm and dry.     Findings: Rash present. No erythema.      Comments: Maceration L 4th5/th toes, pruritic  Neurological:     Mental Status: He is alert and oriented to person, place, and time.     Comments: CN grossly intact, station and gait intact  Psychiatric:        Behavior: Behavior normal.        Thought Content: Thought content normal.        Judgment: Judgment normal.       Results for orders placed or performed in visit on 76/73/41  Basic metabolic panel  Result Value Ref Range   Sodium 139 135 - 145 mEq/L   Potassium 4.2 3.5 - 5.1 mEq/L   Chloride 105 96 - 112 mEq/L   CO2 26 19 - 32 mEq/L   Glucose, Bld 119 (H) 70 - 99 mg/dL   BUN 17 6 - 23 mg/dL   Creatinine, Ser 1.02 0.40 - 1.50 mg/dL   Calcium 9.3 8.4 - 10.5 mg/dL   GFR 83.04 >60.00 mL/min  Hemoglobin A1c  Result Value Ref Range   Hgb A1c MFr Bld 6.1 4.6 - 6.5 %  Lipid panel  Result Value Ref Range   Cholesterol 163 0 - 200 mg/dL   Triglycerides 166.0 (H) 0.0 - 149.0 mg/dL   HDL 38.60 (L) >39.00 mg/dL   VLDL 33.2 0.0 - 40.0 mg/dL   LDL Cholesterol 91 0 - 99 mg/dL   Total CHOL/HDL Ratio 4    NonHDL 124.49    Assessment & Plan:   Problem List Items Addressed This Visit    Prediabetes    Reviewed with patient, encouraged limiting added sugars in diet, handout provided today.       Health maintenance examination - Primary    Preventative protocols reviewed and updated unless pt declined. Discussed healthy diet and lifestyle.       Dyslipidemia    Chronic, stable off meds.  The 10-year ASCVD risk score Craig Wade DC Craig Wade., et al., 2013) is: 2.2%   Values used to calculate the score:     Age: 35 years     Sex: Male     Is Non-Hispanic African American: No     Diabetic: No     Tobacco smoker: No     Systolic Blood Pressure: 937 mmHg     Is BP treated: No     HDL Cholesterol: 38.6 mg/dL     Total Cholesterol: 163 mg/dL       Athlete's foot on left    Discussed management. rec lotrimin BID x 4 wks.          No orders of the defined types were placed in  this encounter.  No orders of the defined types were placed in this encounter.   Follow up plan: Return in about 1  year (around 04/17/2019) for annual exam, prior fasting for blood work.  Ria Bush, MD

## 2018-04-16 ENCOUNTER — Encounter: Payer: Self-pay | Admitting: Family Medicine

## 2018-04-16 ENCOUNTER — Ambulatory Visit (INDEPENDENT_AMBULATORY_CARE_PROVIDER_SITE_OTHER): Payer: Managed Care, Other (non HMO) | Admitting: Family Medicine

## 2018-04-16 VITALS — BP 118/80 | HR 60 | Temp 97.6°F | Ht 69.0 in | Wt 198.2 lb

## 2018-04-16 DIAGNOSIS — R7303 Prediabetes: Secondary | ICD-10-CM | POA: Diagnosis not present

## 2018-04-16 DIAGNOSIS — B353 Tinea pedis: Secondary | ICD-10-CM | POA: Diagnosis not present

## 2018-04-16 DIAGNOSIS — E785 Hyperlipidemia, unspecified: Secondary | ICD-10-CM

## 2018-04-16 DIAGNOSIS — Z0000 Encounter for general adult medical examination without abnormal findings: Secondary | ICD-10-CM

## 2018-04-16 NOTE — Assessment & Plan Note (Signed)
Preventative protocols reviewed and updated unless pt declined. Discussed healthy diet and lifestyle.  

## 2018-04-16 NOTE — Patient Instructions (Addendum)
Schedule dental appointment as you're due.  Sugar stays in prediabetes range - watch added sugars in diet. Work on incorporating aerobic exercise into routine.  You are doing well today Return as needed or in 1 year for next physical. For athlete's foot - keep skin dry, use lotrimin twice daily for 4 weeks.   Prediabetes Prediabetes is the condition of having a blood sugar (blood glucose) level that is higher than it should be, but not high enough for you to be diagnosed with type 2 diabetes. Having prediabetes puts you at risk for developing type 2 diabetes (type 2 diabetes mellitus). Prediabetes may be called impaired glucose tolerance or impaired fasting glucose. Prediabetes usually does not cause symptoms. Your health care provider can diagnose this condition with blood tests. You may be tested for prediabetes if you are overweight and if you have at least one other risk factor for prediabetes. What is blood glucose, and how is it measured? Blood glucose refers to the amount of glucose in your bloodstream. Glucose comes from eating foods that contain sugars and starches (carbohydrates), which the body breaks down into glucose. Your blood glucose level may be measured in mg/dL (milligrams per deciliter) or mmol/L (millimoles per liter). Your blood glucose may be checked with one or more of the following blood tests:  A fasting blood glucose (FBG) test. You will not be allowed to eat (you will fast) for 8 hours or longer before a blood sample is taken. ? A normal range for FBG is 70-100 mg/dl (3.9-5.6 mmol/L).  An A1c (hemoglobin A1c) blood test. This test provides information about blood glucose control over the previous 2?6months.  An oral glucose tolerance test (OGTT). This test measures your blood glucose at two times: ? After fasting. This is your baseline level. ? Two hours after you drink a beverage that contains glucose. You may be diagnosed with prediabetes:  If your FBG is 100?125  mg/dL (5.6-6.9 mmol/L).  If your A1c level is 5.7?6.4%.  If your OGTT result is 140?199 mg/dL (7.8-11 mmol/L). These blood tests may be repeated to confirm your diagnosis. How can this condition affect me? The pancreas produces a hormone (insulin) that helps to move glucose from the bloodstream into cells. When cells in the body do not respond properly to insulin that the body makes (insulin resistance), excess glucose builds up in the blood instead of going into cells. As a result, high blood glucose (hyperglycemia) can develop, which can cause many complications. Hyperglycemia is a symptom of prediabetes. Having high blood glucose for a long time is dangerous. Too much glucose in your blood can damage your nerves and blood vessels. Long-term damage can lead to complications from diabetes, which may include:  Heart disease.  Stroke.  Blindness.  Kidney disease.  Depression.  Poor circulation in the feet and legs, which could lead to surgical removal (amputation) in severe cases. What can increase my risk? Risk factors for prediabetes include:  Having a family member with type 2 diabetes.  Being overweight or obese.  Being older than age 61.  Being of American [place], African-American, Hispanic/Latino, or Asian/Pacific Islander descent.  Having an inactive (sedentary) lifestyle.  Having a history of heart disease.  History of gestational diabetes or polycystic ovary syndrome (PCOS), in women.  Having low levels of good cholesterol (HDL-C) or high levels of blood fats (triglycerides).  Having high blood pressure. What actions can I take to prevent diabetes?      Be physically active. ? Do  moderate-intensity physical activity for 30 or more minutes on 5 or more days of the week, or as much as told by your health care provider. This could be brisk walking, biking, or water aerobics. ? Ask your health care provider what activities are safe for you. A mix of physical  activities may be best, such as walking, swimming, cycling, and strength training.  Lose weight as told by your health care provider. ? Losing 5-7% of your body weight can reverse insulin resistance. ? Your health care provider can determine how much weight loss is best for you and can help you lose weight safely.  Follow a healthy meal plan. This includes eating lean proteins, complex carbohydrates, fresh fruits and vegetables, low-fat dairy products, and healthy fats. ? Follow instructions from your health care provider about eating or drinking restrictions. ? Make an appointment to see a diet and nutrition specialist (registered dietitian) to help you create a healthy eating plan that is right for you.  Do not smoke or use any tobacco products, such as cigarettes, chewing tobacco, and e-cigarettes. If you need help quitting, ask your health care provider.  Take over-the-counter and prescription medicines as told by your health care provider. You may be prescribed medicines that help lower the risk of type 2 diabetes.  Keep all follow-up visits as told by your health care provider. This is important. Summary  Prediabetes is the condition of having a blood sugar (blood glucose) level that is higher than it should be, but not high enough for you to be diagnosed with type 2 diabetes.  Having prediabetes puts you at risk for developing type 2 diabetes (type 2 diabetes mellitus).  To help prevent type 2 diabetes, make lifestyle changes such as being physically active and eating a healthy diet. Lose weight as told by your health care provider. This information is not intended to replace advice given to you by your health care provider. Make sure you discuss any questions you have with your health care provider. Document Released: 07/09/2015 Document Revised: 11/04/2016 Document Reviewed: 05/08/2015 Elsevier Interactive Patient Education  2019 [employer].

## 2018-04-16 NOTE — Assessment & Plan Note (Signed)
Chronic, stable off meds.  The 10-year ASCVD risk score Mikey Bussing DC Brooke Bonito., et al., 2013) is: 2.2%   Values used to calculate the score:     Age: 48 years     Sex: Male     Is Non-Hispanic African American: No     Diabetic: No     Tobacco smoker: No     Systolic Blood Pressure: 488 mmHg     Is BP treated: No     HDL Cholesterol: 38.6 mg/dL     Total Cholesterol: 163 mg/dL

## 2018-04-16 NOTE — Assessment & Plan Note (Signed)
Reviewed with patient, encouraged limiting added sugars in diet, handout provided today.

## 2018-04-16 NOTE — Assessment & Plan Note (Signed)
Discussed management. rec lotrimin BID x 4 wks.

## 2019-04-14 ENCOUNTER — Telehealth: Payer: Self-pay

## 2019-04-14 NOTE — Telephone Encounter (Signed)
LVM w COVID screen, front door and back lab 1.14.2021 TLJ

## 2019-04-17 ENCOUNTER — Other Ambulatory Visit: Payer: Self-pay | Admitting: Family Medicine

## 2019-04-17 DIAGNOSIS — R7303 Prediabetes: Secondary | ICD-10-CM

## 2019-04-17 DIAGNOSIS — E785 Hyperlipidemia, unspecified: Secondary | ICD-10-CM

## 2019-04-18 ENCOUNTER — Other Ambulatory Visit: Payer: Self-pay

## 2019-04-18 ENCOUNTER — Other Ambulatory Visit (INDEPENDENT_AMBULATORY_CARE_PROVIDER_SITE_OTHER): Payer: Managed Care, Other (non HMO)

## 2019-04-18 DIAGNOSIS — R7303 Prediabetes: Secondary | ICD-10-CM | POA: Diagnosis not present

## 2019-04-18 DIAGNOSIS — E785 Hyperlipidemia, unspecified: Secondary | ICD-10-CM

## 2019-04-18 LAB — COMPREHENSIVE METABOLIC PANEL
ALT: 32 U/L (ref 0–53)
AST: 24 U/L (ref 0–37)
Albumin: 4.5 g/dL (ref 3.5–5.2)
Alkaline Phosphatase: 65 U/L (ref 39–117)
BUN: 12 mg/dL (ref 6–23)
CO2: 29 mEq/L (ref 19–32)
Calcium: 8.8 mg/dL (ref 8.4–10.5)
Chloride: 105 mEq/L (ref 96–112)
Creatinine, Ser: 0.95 mg/dL (ref 0.40–1.50)
GFR: 84.44 mL/min (ref >60.00)
Glucose, Bld: 132 mg/dL — ABNORMAL HIGH (ref 70–99)
Potassium: 4.2 mEq/L (ref 3.5–5.1)
Sodium: 141 mEq/L (ref 135–145)
Total Bilirubin: 0.7 mg/dL (ref 0.2–1.2)
Total Protein: 7 g/dL (ref 6.0–8.3)

## 2019-04-18 LAB — LIPID PANEL
Cholesterol: 108 mg/dL (ref 0–200)
HDL: 30.2 mg/dL — ABNORMAL LOW (ref >39.00)
LDL Cholesterol: 43 mg/dL (ref 0–99)
NonHDL: 78.06
Total CHOL/HDL Ratio: 4
Triglycerides: 173 mg/dL — ABNORMAL HIGH (ref 0.0–149.0)
VLDL: 34.6 mg/dL (ref 0.0–40.0)

## 2019-04-18 LAB — HEMOGLOBIN A1C: Hgb A1c MFr Bld: 6.2 % (ref 4.6–6.5)

## 2019-04-22 ENCOUNTER — Ambulatory Visit (INDEPENDENT_AMBULATORY_CARE_PROVIDER_SITE_OTHER): Payer: Managed Care, Other (non HMO) | Admitting: Family Medicine

## 2019-04-22 ENCOUNTER — Encounter: Payer: Self-pay | Admitting: Family Medicine

## 2019-04-22 ENCOUNTER — Other Ambulatory Visit: Payer: Self-pay

## 2019-04-22 VITALS — BP 138/84 | HR 68 | Temp 98.2°F | Ht 69.0 in | Wt 196.1 lb

## 2019-04-22 DIAGNOSIS — R7303 Prediabetes: Secondary | ICD-10-CM

## 2019-04-22 DIAGNOSIS — E785 Hyperlipidemia, unspecified: Secondary | ICD-10-CM | POA: Diagnosis not present

## 2019-04-22 DIAGNOSIS — Z0000 Encounter for general adult medical examination without abnormal findings: Secondary | ICD-10-CM

## 2019-04-22 DIAGNOSIS — Z1211 Encounter for screening for malignant neoplasm of colon: Secondary | ICD-10-CM

## 2019-04-22 NOTE — Addendum Note (Signed)
Addended by: Ria Bush on: 04/22/2019 07:54 AM   Modules accepted: Orders

## 2019-04-22 NOTE — Patient Instructions (Addendum)
Pass by lab to pick up stool kit.  Congratulations on cholesterol levels! You are doing well today. Return as needed or in 1 year for next physical  Health Maintenance, Male Adopting a healthy lifestyle and getting preventive care are important in promoting health and wellness. Ask your health care provider about:  The right schedule for you to have regular tests and exams.  Things you can do on your own to prevent diseases and keep yourself healthy. What should I know about diet, weight, and exercise? Eat a healthy diet   Eat a diet that includes plenty of vegetables, fruits, low-fat dairy products, and lean protein.  Do not eat a lot of foods that are high in solid fats, added sugars, or sodium. Maintain a healthy weight Body mass index (BMI) is a measurement that can be used to identify possible weight problems. It estimates body fat based on height and weight. Your health care provider can help determine your BMI and help you achieve or maintain a healthy weight. Get regular exercise Get regular exercise. This is one of the most important things you can do for your health. Most adults should:  Exercise for at least 150 minutes each week. The exercise should increase your heart rate and make you sweat (moderate-intensity exercise).  Do strengthening exercises at least twice a week. This is in addition to the moderate-intensity exercise.  Spend less time sitting. Even light physical activity can be beneficial. Watch cholesterol and blood lipids Have your blood tested for lipids and cholesterol at 49 years of age, then have this test every 5 years. You may need to have your cholesterol levels checked more often if:  Your lipid or cholesterol levels are high.  You are older than 49 years of age.  You are at high risk for heart disease. What should I know about cancer screening? Many types of cancers can be detected early and may often be prevented. Depending on your health  history and family history, you may need to have cancer screening at various ages. This may include screening for:  Colorectal cancer.  Prostate cancer.  Skin cancer.  Lung cancer. What should I know about heart disease, diabetes, and high blood pressure? Blood pressure and heart disease  High blood pressure causes heart disease and increases the risk of stroke. This is more likely to develop in people who have high blood pressure readings, are of African descent, or are overweight.  Talk with your health care provider about your target blood pressure readings.  Have your blood pressure checked: ? Every 3-5 years if you are 36-24 years of age. ? Every year if you are 80 years old or older.  If you are between the ages of 36 and 72 and are a current or former smoker, ask your health care provider if you should have a one-time screening for abdominal aortic aneurysm (AAA). Diabetes Have regular diabetes screenings. This checks your fasting blood sugar level. Have the screening done:  Once every three years after age 23 if you are at a normal weight and have a low risk for diabetes.  More often and at a younger age if you are overweight or have a high risk for diabetes. What should I know about preventing infection? Hepatitis B If you have a higher risk for hepatitis B, you should be screened for this virus. Talk with your health care provider to find out if you are at risk for hepatitis B infection. Hepatitis C Blood testing is  recommended for:  Everyone born from [birth year] through 1965.  Anyone with known risk factors for hepatitis C. Sexually transmitted infections (STIs)  You should be screened each year for STIs, including gonorrhea and chlamydia, if: ? You are sexually active and are younger than 49 years of age. ? You are older than 49 years of age and your health care provider tells you that you are at risk for this type of infection. ? Your sexual activity has changed since  you were last screened, and you are at increased risk for chlamydia or gonorrhea. Ask your health care provider if you are at risk.  Ask your health care provider about whether you are at high risk for HIV. Your health care provider may recommend a prescription medicine to help prevent HIV infection. If you choose to take medicine to prevent HIV, you should first get tested for HIV. You should then be tested every 3 months for as long as you are taking the medicine. Follow these instructions at home: Lifestyle  Do not use any products that contain nicotine or tobacco, such as cigarettes, e-cigarettes, and chewing tobacco. If you need help quitting, ask your health care provider.  Do not use street drugs.  Do not share needles.  Ask your health care provider for help if you need support or information about quitting drugs. Alcohol use  Do not drink alcohol if your health care provider tells you not to drink.  If you drink alcohol: ? Limit how much you have to 0-2 drinks a day. ? Be aware of how much alcohol is in your drink. In the [place] one drink equals one 12 oz bottle of beer (355 mL), one 5 oz glass of wine (148 mL), or one 1 oz glass of hard liquor (44 mL). General instructions  Schedule regular health, dental, and eye exams.  Stay current with your vaccines.  Tell your health care provider if: ? You often feel depressed. ? You have ever been abused or do not feel safe at home. Summary  Adopting a healthy lifestyle and getting preventive care are important in promoting health and wellness.  Follow your health care provider's instructions about healthy diet, exercising, and getting tested or screened for diseases.  Follow your health care provider's instructions on monitoring your cholesterol and blood pressure. This information is not intended to replace advice given to you by your health care provider. Make sure you discuss any questions you have with your health care  provider. Document Revised: 03/10/2018 Document Reviewed: 03/10/2018 Elsevier Patient Education  2020 [employer].

## 2019-04-22 NOTE — Assessment & Plan Note (Signed)
Preventative protocols reviewed and updated unless pt declined. Discussed healthy diet and lifestyle.  Discussed colon cancer screening - will start with iFOB

## 2019-04-22 NOTE — Assessment & Plan Note (Signed)
Encouraged watching added sugars in diet.  

## 2019-04-22 NOTE — Assessment & Plan Note (Signed)
Chronic, marked improvement unclear cause. Will continue to monitor.  The ASCVD Risk score Craig Bussing DC Jr., et al., 2013) failed to calculate for the following reasons:   The valid total cholesterol range is 130 to 320 mg/dL

## 2019-04-22 NOTE — Progress Notes (Addendum)
This visit was conducted in person.  BP 138/84 (BP Location: Left Arm, Patient Position: Sitting, Cuff Size: Normal)   Pulse 68   Temp 98.2 F (36.8 C) (Temporal)   Ht _0  (1.753 m)   Wt 196 lb 1 oz (88.9 kg)   SpO2 96%   BMI 28.95 kg/m    CC: CPE Subjective:    Patient ID: Craig Wade, male    DOB: 1970/11/11, 49 y.o.   MRN: [id number]  HPI: Craig Wade is a 49 y.o. male presenting on 04/22/2019 for Annual Exam   Preventative: Colon cancer screening - discussed options, would like stool kit. Flu shot - declines Tdap2016 Seat beltuse discussed Sunscreen use discussed. Denies changing moles. 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 Interim medical history since our last visit reviewed. Allergies and medications reviewed and updated. No outpatient medications prior to visit.   No facility-administered medications prior to visit.     Per HPI unless specifically indicated in ROS section below Review of Systems  Constitutional: Negative for activity change, appetite change, chills, fatigue, fever and unexpected weight change.  HENT: Negative for hearing loss.   Eyes: Negative for visual disturbance.  Respiratory: Positive for cough (a few weeks ago). Negative for chest tightness, shortness of breath and wheezing.   Cardiovascular: Negative for chest pain, palpitations and leg swelling.  Gastrointestinal: Negative for abdominal distention, abdominal pain, blood in stool, constipation, diarrhea, nausea and vomiting.  Genitourinary: Negative for difficulty urinating and hematuria.  Musculoskeletal: Negative for arthralgias, myalgias and neck pain.  Skin:  Negative for rash.  Neurological: Negative for dizziness, seizures, syncope and headaches.  Hematological: Negative for adenopathy. Does not bruise/bleed easily.  Psychiatric/Behavioral: Negative for dysphoric mood. The patient is not nervous/anxious.    Objective:    BP 138/84 (BP Location: Left Arm, Patient Position: Sitting, Cuff Size: Normal)   Pulse 68   Temp 98.2 F (36.8 C) (Temporal)   Ht _1  (1.753 m)   Wt 196 lb 1 oz (88.9 kg)   SpO2 96%   BMI 28.95 kg/m   Wt Readings from Last 3 Encounters:  04/22/19 196 lb 1 oz (88.9 kg)  04/16/18 198 lb 4 oz (89.9 kg)  04/13/17 196 lb 8 oz (89.1 kg)    Physical Exam Vitals and nursing note reviewed.  Constitutional:      General: He is not in acute distress.    Appearance: Normal appearance. He is well-developed. He is not ill-appearing.  HENT:     Head: Normocephalic and atraumatic.     Right Ear: Hearing, tympanic membrane, ear canal and external ear normal.     Left Ear: Hearing, tympanic membrane, ear canal and external ear normal.     Mouth/Throat:     Mouth: Mucous membranes are moist.     Pharynx: Oropharynx is clear. Uvula midline. No oropharyngeal exudate or posterior oropharyngeal erythema.  Eyes:     General: No scleral icterus.    Extraocular Movements: Extraocular movements intact.     Conjunctiva/sclera: Conjunctivae normal.     Pupils: Pupils are equal, round, and reactive to light.  Cardiovascular:     Rate and Rhythm:  Normal rate and regular rhythm.     Pulses: Normal pulses.          Radial pulses are 2+ on the right side and 2+ on the left side.     Heart sounds: Normal heart sounds. No murmur.  Pulmonary:     Effort: Pulmonary effort is normal. No respiratory distress.     Breath sounds: Normal breath sounds. No wheezing, rhonchi or rales.  Abdominal:     General: Abdomen is flat. Bowel sounds are normal. There is no distension.     Palpations: Abdomen is soft. There is no mass.     Tenderness: There  is no abdominal tenderness. There is no guarding or rebound.     Hernia: No hernia is present.  Musculoskeletal:        General: Normal range of motion.     Cervical back: Normal range of motion and neck supple.     Right lower leg: No edema.     Left lower leg: No edema.  Lymphadenopathy:     Cervical: No cervical adenopathy.  Skin:    General: Skin is warm and dry.     Findings: No rash.  Neurological:     General: No focal deficit present.     Mental Status: He is alert and oriented to person, place, and time.     Comments: CN grossly intact, station and gait intact  Psychiatric:        Mood and Affect: Mood normal.        Behavior: Behavior normal.        Thought Content: Thought content normal.        Judgment: Judgment normal.       Results for orders placed or performed in visit on 04/18/19  Hemoglobin A1c  Result Value Ref Range   Hgb A1c MFr Bld 6.2 4.6 - 6.5 %  Comprehensive metabolic panel  Result Value Ref Range   Sodium 141 135 - 145 mEq/L   Potassium 4.2 3.5 - 5.1 mEq/L   Chloride 105 96 - 112 mEq/L   CO2 29 19 - 32 mEq/L   Glucose, Bld 132 (H) 70 - 99 mg/dL   BUN 12 6 - 23 mg/dL   Creatinine, Ser 0.95 0.40 - 1.50 mg/dL   Total Bilirubin 0.7 0.2 - 1.2 mg/dL   Alkaline Phosphatase 65 39 - 117 U/L   AST 24 0 - 37 U/L   ALT 32 0 - 53 U/L   Total Protein 7.0 6.0 - 8.3 g/dL   Albumin 4.5 3.5 - 5.2 g/dL   GFR 84.44 >60.00 mL/min   Calcium 8.8 8.4 - 10.5 mg/dL  Lipid panel  Result Value Ref Range   Cholesterol 108 0 - 200 mg/dL   Triglycerides 173.0 (H) 0.0 - 149.0 mg/dL   HDL 30.20 (L) >39.00 mg/dL   VLDL 34.6 0.0 - 40.0 mg/dL   LDL Cholesterol 43 0 - 99 mg/dL   Total CHOL/HDL Ratio 4    NonHDL 78.06    Assessment & Plan:  This visit occurred during the SARS-CoV-2 public health emergency.  Safety protocols were in place, including screening questions prior to the visit, additional usage of staff PPE, and extensive cleaning of exam room while observing  appropriate contact time as indicated for disinfecting solutions.   Problem List Items Addressed This Visit    Prediabetes    Encouraged watching added sugars in diet.       Health maintenance examination - Primary  Preventative protocols reviewed and updated unless pt declined. Discussed healthy diet and lifestyle.  Discussed colon cancer screening - will start with iFOB      Dyslipidemia    Chronic, marked improvement unclear cause. Will continue to monitor.  The ASCVD Risk score Mikey Bussing DC Jr., et al., 2013) failed to calculate for the following reasons:   The valid total cholesterol range is 130 to 320 mg/dL        Other Visit Diagnoses    Special screening for malignant neoplasms, colon       Relevant Orders   Fecal occult blood, imunochemical       No orders of the defined types were placed in this encounter.  Orders Placed This Encounter  Procedures  . Fecal occult blood, imunochemical    Standing Status:   Future    Standing Expiration Date:   04/21/2020    Patient instructions: Pass by lab to pick up stool kit.  Congratulations on cholesterol levels! You are doing well today. Return as needed or in 1 year for next physical  Follow up plan: Return in about 1 year (around 04/21/2020) for annual exam, prior fasting for blood work.  Ria Bush, MD

## 2019-04-27 ENCOUNTER — Other Ambulatory Visit (INDEPENDENT_AMBULATORY_CARE_PROVIDER_SITE_OTHER): Payer: Managed Care, Other (non HMO)

## 2019-04-27 DIAGNOSIS — Z1211 Encounter for screening for malignant neoplasm of colon: Secondary | ICD-10-CM

## 2019-04-27 LAB — FECAL OCCULT BLOOD, IMMUNOCHEMICAL: Fecal Occult Bld: NEGATIVE

## 2019-04-27 LAB — FECAL OCCULT BLOOD, GUAIAC: Fecal Occult Blood: NEGATIVE

## 2019-04-29 ENCOUNTER — Encounter: Payer: Self-pay | Admitting: Family Medicine

## 2020-04-19 ENCOUNTER — Other Ambulatory Visit: Payer: Self-pay | Admitting: Family Medicine

## 2020-04-19 DIAGNOSIS — E785 Hyperlipidemia, unspecified: Secondary | ICD-10-CM

## 2020-04-19 DIAGNOSIS — R7303 Prediabetes: Secondary | ICD-10-CM

## 2020-04-23 ENCOUNTER — Other Ambulatory Visit (INDEPENDENT_AMBULATORY_CARE_PROVIDER_SITE_OTHER): Payer: Managed Care, Other (non HMO)

## 2020-04-23 ENCOUNTER — Other Ambulatory Visit: Payer: Self-pay

## 2020-04-23 DIAGNOSIS — R7303 Prediabetes: Secondary | ICD-10-CM

## 2020-04-23 DIAGNOSIS — E785 Hyperlipidemia, unspecified: Secondary | ICD-10-CM | POA: Diagnosis not present

## 2020-04-23 LAB — LIPID PANEL
Cholesterol: 151 mg/dL (ref 0–200)
HDL: 39.3 mg/dL (ref >39.00)
LDL Cholesterol: 78 mg/dL (ref 0–99)
NonHDL: 111.41
Total CHOL/HDL Ratio: 4
Triglycerides: 166 mg/dL — ABNORMAL HIGH (ref 0.0–149.0)
VLDL: 33.2 mg/dL (ref 0.0–40.0)

## 2020-04-23 LAB — COMPREHENSIVE METABOLIC PANEL
ALT: 52 U/L (ref 0–53)
AST: 24 U/L (ref 0–37)
Albumin: 4.6 g/dL (ref 3.5–5.2)
Alkaline Phosphatase: 68 U/L (ref 39–117)
BUN: 12 mg/dL (ref 6–23)
CO2: 28 mEq/L (ref 19–32)
Calcium: 9.3 mg/dL (ref 8.4–10.5)
Chloride: 105 mEq/L (ref 96–112)
Creatinine, Ser: 1.08 mg/dL (ref 0.40–1.50)
GFR: 80.59 mL/min (ref >60.00)
Glucose, Bld: 129 mg/dL — ABNORMAL HIGH (ref 70–99)
Potassium: 4 mEq/L (ref 3.5–5.1)
Sodium: 140 mEq/L (ref 135–145)
Total Bilirubin: 0.9 mg/dL (ref 0.2–1.2)
Total Protein: 6.9 g/dL (ref 6.0–8.3)

## 2020-04-23 LAB — HEMOGLOBIN A1C: Hgb A1c MFr Bld: 6.3 % (ref 4.6–6.5)

## 2020-04-27 ENCOUNTER — Other Ambulatory Visit: Payer: Self-pay

## 2020-04-27 ENCOUNTER — Ambulatory Visit (INDEPENDENT_AMBULATORY_CARE_PROVIDER_SITE_OTHER): Payer: Managed Care, Other (non HMO) | Admitting: Family Medicine

## 2020-04-27 ENCOUNTER — Encounter: Payer: Self-pay | Admitting: Family Medicine

## 2020-04-27 VITALS — BP 138/90 | HR 79 | Temp 97.5°F | Ht 69.5 in | Wt 201.4 lb

## 2020-04-27 DIAGNOSIS — E785 Hyperlipidemia, unspecified: Secondary | ICD-10-CM

## 2020-04-27 DIAGNOSIS — R7303 Prediabetes: Secondary | ICD-10-CM

## 2020-04-27 DIAGNOSIS — Z1211 Encounter for screening for malignant neoplasm of colon: Secondary | ICD-10-CM | POA: Diagnosis not present

## 2020-04-27 DIAGNOSIS — Z0000 Encounter for general adult medical examination without abnormal findings: Secondary | ICD-10-CM

## 2020-04-27 NOTE — Assessment & Plan Note (Addendum)
Preventative protocols reviewed and updated unless pt declined. Discussed healthy diet and lifestyle.  Advised to start monitoring BP at home, let me know if consistently >140/90. Advised to back off salt use.

## 2020-04-27 NOTE — Progress Notes (Signed)
Patient ID: Craig Wade, male    DOB: [birth year], 50 y.o.   MRN: [id number]  This visit was conducted in person.  BP 138/90 (BP Location: Left Arm, Patient Position: Sitting, Cuff Size: Normal)   Pulse 79   Temp (!) 97.5 F (36.4 C) (Temporal)   Ht 5' 9.5" (1.765 m)   Wt 201 lb 7 oz (91.4 kg)   SpO2 98%   BMI 29.32 kg/m    CC: CPE Subjective:   HPI: Craig Wade is a 50 y.o. male presenting on 04/27/2020 for Annual Exam   Has home BP cuff, but hasn't been cheking. Eating more salt recently.   Notes an index finger occasionally getting cold, no color change. Doesn't remember which finger. Tends to happen in summer. Denies neck pain, radiculopathy symptoms or numbness/tingling.   Preventative: Colon cancer screening - iFOB negative 2021.  Flu shot - declines COVID vaccine - discussed, declines  Tdap2016  Seat beltuse discussed  Sunscreen use discussed. Denies changing moles. Non smoker  Alcohol - 2 oz/night liquor  Dentist - yearly  Eye exam q3 yrs  Caffeine: 1 cup/day Lives with wife, 2 children, 1 dog  Occupation: Arboriculturist tile  Activity: no regular exercise  Diet: good water, some fruits/vegetables     Relevant past medical, surgical, family and social history reviewed and updated as indicated. Interim medical history since our last visit reviewed. Allergies and medications reviewed and updated. No outpatient medications prior to visit.   No facility-administered medications prior to visit.     Per HPI unless specifically indicated in ROS section below Review of Systems  Constitutional: Negative for activity change, appetite change, chills, fatigue, fever and unexpected weight change.  HENT: Negative for hearing loss.   Eyes: Negative for visual disturbance.  Respiratory: Negative for cough, chest tightness, shortness of breath and wheezing.   Cardiovascular: Negative for chest pain, palpitations and leg swelling.  Gastrointestinal:  Negative for abdominal distention, abdominal pain, blood in stool, constipation, diarrhea, nausea and vomiting.  Genitourinary: Negative for difficulty urinating and hematuria.  Musculoskeletal: Negative for arthralgias, myalgias and neck pain.  Skin: Negative for rash.  Neurological: Negative for dizziness, seizures, syncope and headaches.  Hematological: Negative for adenopathy. Does not bruise/bleed easily.  Psychiatric/Behavioral: Negative for dysphoric mood. The patient is not nervous/anxious.    Objective:  BP 138/90 (BP Location: Left Arm, Patient Position: Sitting, Cuff Size: Normal)   Pulse 79   Temp (!) 97.5 F (36.4 C) (Temporal)   Ht 5' 9.5" (1.765 m)   Wt 201 lb 7 oz (91.4 kg)   SpO2 98%   BMI 29.32 kg/m   Wt Readings from Last 3 Encounters:  04/27/20 201 lb 7 oz (91.4 kg)  04/22/19 196 lb 1 oz (88.9 kg)  04/16/18 198 lb 4 oz (89.9 kg)      Physical Exam Vitals and nursing note reviewed.  Constitutional:      General: He is not in acute distress.    Appearance: Normal appearance. He is well-developed and well-nourished. He is not ill-appearing.  HENT:     Head: Normocephalic and atraumatic.     Right Ear: Hearing, tympanic membrane, ear canal and external ear normal.     Left Ear: Hearing, tympanic membrane, ear canal and external ear normal.     Mouth/Throat:     Mouth: Oropharynx is clear and moist and mucous membranes are normal.     Pharynx: No posterior oropharyngeal edema.  Eyes:  General: No scleral icterus.    Extraocular Movements: Extraocular movements intact and EOM normal.     Conjunctiva/sclera: Conjunctivae normal.     Pupils: Pupils are equal, round, and reactive to light.  Neck:     Thyroid: No thyroid mass or thyromegaly.  Cardiovascular:     Rate and Rhythm: Normal rate and regular rhythm.     Pulses: Normal pulses and intact distal pulses.          Radial pulses are 2+ on the right side and 2+ on the left side.     Heart sounds: Normal  heart sounds. No murmur heard.   Pulmonary:     Effort: Pulmonary effort is normal. No respiratory distress.     Breath sounds: Normal breath sounds. No wheezing, rhonchi or rales.  Abdominal:     General: Abdomen is flat. Bowel sounds are normal. There is no distension.     Palpations: Abdomen is soft. There is no mass.     Tenderness: There is no abdominal tenderness. There is no guarding or rebound.     Hernia: No hernia is present.  Musculoskeletal:        General: No edema. Normal range of motion.     Cervical back: Normal range of motion and neck supple.     Right lower leg: No edema.     Left lower leg: No edema.  Lymphadenopathy:     Cervical: No cervical adenopathy.  Skin:    General: Skin is warm and dry.     Findings: No rash.  Neurological:     General: No focal deficit present.     Mental Status: He is alert and oriented to person, place, and time.     Comments: CN grossly intact, station and gait intact  Psychiatric:        Mood and Affect: Mood and affect and mood normal.        Behavior: Behavior normal.        Thought Content: Thought content normal.        Judgment: Judgment normal.       Results for orders placed or performed in visit on 04/23/20  Hemoglobin A1c  Result Value Ref Range   Hgb A1c MFr Bld 6.3 4.6 - 6.5 %  Comprehensive metabolic panel  Result Value Ref Range   Sodium 140 135 - 145 mEq/L   Potassium 4.0 3.5 - 5.1 mEq/L   Chloride 105 96 - 112 mEq/L   CO2 28 19 - 32 mEq/L   Glucose, Bld 129 (H) 70 - 99 mg/dL   BUN 12 6 - 23 mg/dL   Creatinine, Ser 1.08 0.40 - 1.50 mg/dL   Total Bilirubin 0.9 0.2 - 1.2 mg/dL   Alkaline Phosphatase 68 39 - 117 U/L   AST 24 0 - 37 U/L   ALT 52 0 - 53 U/L   Total Protein 6.9 6.0 - 8.3 g/dL   Albumin 4.6 3.5 - 5.2 g/dL   GFR 80.59 >60.00 mL/min   Calcium 9.3 8.4 - 10.5 mg/dL  Lipid panel  Result Value Ref Range   Cholesterol 151 0 - 200 mg/dL   Triglycerides 166.0 (H) 0.0 - 149.0 mg/dL   HDL 39.30  >39.00 mg/dL   VLDL 33.2 0.0 - 40.0 mg/dL   LDL Cholesterol 78 0 - 99 mg/dL   Total CHOL/HDL Ratio 4    NonHDL 111.41    Assessment & Plan:  This visit occurred during the SARS-CoV-2 public health emergency.  Safety protocols were in place, including screening questions prior to the visit, additional usage of staff PPE, and extensive cleaning of exam room while observing appropriate contact time as indicated for disinfecting solutions.   Problem List Items Addressed This Visit    Prediabetes    Encouraged watching sugars/carbs.       Health maintenance examination - Primary    Preventative protocols reviewed and updated unless pt declined. Discussed healthy diet and lifestyle.  Advised to start monitoring BP at home, let me know if consistently >140/90. Advised to back off salt use.       Dyslipidemia    Chronic, stable off medication. The 10-year ASCVD risk score Mikey Bussing DC Brooke Bonito., et al., 2013) is: 3.1%   Values used to calculate the score:     Age: 27 years     Sex: Male     Is Non-Hispanic African American: No     Diabetic: No     Tobacco smoker: No     Systolic Blood Pressure: 281 mmHg     Is BP treated: No     HDL Cholesterol: 39.3 mg/dL     Total Cholesterol: 151 mg/dL        Other Visit Diagnoses    Special screening for malignant neoplasms, colon       Relevant Orders   Fecal occult blood, imunochemical       No orders of the defined types were placed in this encounter.  Orders Placed This Encounter  Procedures  . Fecal occult blood, imunochemical    Standing Status:   Future    Standing Expiration Date:   04/27/2021    Patient instructions: BP was borderline today - back off salt and start monitoring at home, let me know if staying >140/90.  Pass by lab to pick up stool kit.  Good to see you today Return as needed or in 1 year for next physical.  Follow up plan: Return in about 1 year (around 04/27/2021) for annual exam, prior fasting for blood  work.  Ria Bush, MD

## 2020-04-27 NOTE — Patient Instructions (Addendum)
BP was borderline today - back off salt and start monitoring at home, let me know if staying >140/90.  Pass by lab to pick up stool kit.  Good to see you today Return as needed or in 1 year for next physical.  Health Maintenance, Male Adopting a healthy lifestyle and getting preventive care are important in promoting health and wellness. Ask your health care provider about:  The right schedule for you to have regular tests and exams.  Things you can do on your own to prevent diseases and keep yourself healthy. What should I know about diet, weight, and exercise? Eat a healthy diet  Eat a diet that includes plenty of vegetables, fruits, low-fat dairy products, and lean protein.  Do not eat a lot of foods that are high in solid fats, added sugars, or sodium.   Maintain a healthy weight Body mass index (BMI) is a measurement that can be used to identify possible weight problems. It estimates body fat based on height and weight. Your health care provider can help determine your BMI and help you achieve or maintain a healthy weight. Get regular exercise Get regular exercise. This is one of the most important things you can do for your health. Most adults should:  Exercise for at least 150 minutes each week. The exercise should increase your heart rate and make you sweat (moderate-intensity exercise).  Do strengthening exercises at least twice a week. This is in addition to the moderate-intensity exercise.  Spend less time sitting. Even light physical activity can be beneficial. Watch cholesterol and blood lipids Have your blood tested for lipids and cholesterol at 50 years of age, then have this test every 5 years. You may need to have your cholesterol levels checked more often if:  Your lipid or cholesterol levels are high.  You are older than 50 years of age.  You are at high risk for heart disease. What should I know about cancer screening? Many types of cancers can be detected  early and may often be prevented. Depending on your health history and family history, you may need to have cancer screening at various ages. This may include screening for:  Colorectal cancer.  Prostate cancer.  Skin cancer.  Lung cancer. What should I know about heart disease, diabetes, and high blood pressure? Blood pressure and heart disease  High blood pressure causes heart disease and increases the risk of stroke. This is more likely to develop in people who have high blood pressure readings, are of African descent, or are overweight.  Talk with your health care provider about your target blood pressure readings.  Have your blood pressure checked: ? Every 3-5 years if you are 69-81 years of age. ? Every year if you are 79 years old or older.  If you are between the ages of 23 and 26 and are a current or former smoker, ask your health care provider if you should have a one-time screening for abdominal aortic aneurysm (AAA). Diabetes Have regular diabetes screenings. This checks your fasting blood sugar level. Have the screening done:  Once every three years after age 107 if you are at a normal weight and have a low risk for diabetes.  More often and at a younger age if you are overweight or have a high risk for diabetes. What should I know about preventing infection? Hepatitis B If you have a higher risk for hepatitis B, you should be screened for this virus. Talk with your health care provider  to find out if you are at risk for hepatitis B infection. Hepatitis C Blood testing is recommended for:  Everyone born from [birth year] through 1965.  Anyone with known risk factors for hepatitis C. Sexually transmitted infections (STIs)  You should be screened each year for STIs, including gonorrhea and chlamydia, if: ? You are sexually active and are younger than 50 years of age. ? You are older than 50 years of age and your health care provider tells you that you are at risk for this  type of infection. ? Your sexual activity has changed since you were last screened, and you are at increased risk for chlamydia or gonorrhea. Ask your health care provider if you are at risk.  Ask your health care provider about whether you are at high risk for HIV. Your health care provider may recommend a prescription medicine to help prevent HIV infection. If you choose to take medicine to prevent HIV, you should first get tested for HIV. You should then be tested every 3 months for as long as you are taking the medicine. Follow these instructions at home: Lifestyle  Do not use any products that contain nicotine or tobacco, such as cigarettes, e-cigarettes, and chewing tobacco. If you need help quitting, ask your health care provider.  Do not use street drugs.  Do not share needles.  Ask your health care provider for help if you need support or information about quitting drugs. Alcohol use  Do not drink alcohol if your health care provider tells you not to drink.  If you drink alcohol: ? Limit how much you have to 0-2 drinks a day. ? Be aware of how much alcohol is in your drink. In the [place] one drink equals one 12 oz bottle of beer (355 mL), one 5 oz glass of wine (148 mL), or one 1 oz glass of hard liquor (44 mL). General instructions  Schedule regular health, dental, and eye exams.  Stay current with your vaccines.  Tell your health care provider if: ? You often feel depressed. ? You have ever been abused or do not feel safe at home. Summary  Adopting a healthy lifestyle and getting preventive care are important in promoting health and wellness.  Follow your health care provider's instructions about healthy diet, exercising, and getting tested or screened for diseases.  Follow your health care provider's instructions on monitoring your cholesterol and blood pressure. This information is not intended to replace advice given to you by your health care provider. Make sure  you discuss any questions you have with your health care provider. Document Revised: 03/10/2018 Document Reviewed: 03/10/2018 Elsevier Patient Education  2021 [employer].

## 2020-04-27 NOTE — Assessment & Plan Note (Signed)
Chronic, stable off medication. The 10-year ASCVD risk score Mikey Bussing DC Brooke Bonito., et al., 2013) is: 3.1%   Values used to calculate the score:     Age: 50 years     Sex: Male     Is Non-Hispanic African American: No     Diabetic: No     Tobacco smoker: No     Systolic Blood Pressure: 037 mmHg     Is BP treated: No     HDL Cholesterol: 39.3 mg/dL     Total Cholesterol: 151 mg/dL

## 2020-04-27 NOTE — Assessment & Plan Note (Signed)
Encouraged watching sugars/carbs.

## 2020-05-03 ENCOUNTER — Other Ambulatory Visit (INDEPENDENT_AMBULATORY_CARE_PROVIDER_SITE_OTHER): Payer: Managed Care, Other (non HMO)

## 2020-05-03 DIAGNOSIS — Z1211 Encounter for screening for malignant neoplasm of colon: Secondary | ICD-10-CM

## 2020-05-03 LAB — FECAL OCCULT BLOOD, GUAIAC: Fecal Occult Blood: NEGATIVE

## 2020-05-03 LAB — FECAL OCCULT BLOOD, IMMUNOCHEMICAL: Fecal Occult Bld: NEGATIVE

## 2020-05-04 ENCOUNTER — Encounter: Payer: Self-pay | Admitting: Family Medicine

## 2021-04-20 ENCOUNTER — Other Ambulatory Visit: Payer: Self-pay | Admitting: Family Medicine

## 2021-04-20 DIAGNOSIS — Z1159 Encounter for screening for other viral diseases: Secondary | ICD-10-CM

## 2021-04-20 DIAGNOSIS — Z125 Encounter for screening for malignant neoplasm of prostate: Secondary | ICD-10-CM

## 2021-04-20 DIAGNOSIS — R7303 Prediabetes: Secondary | ICD-10-CM

## 2021-04-20 DIAGNOSIS — E785 Hyperlipidemia, unspecified: Secondary | ICD-10-CM

## 2021-04-26 ENCOUNTER — Other Ambulatory Visit: Payer: Managed Care, Other (non HMO)

## 2021-05-03 ENCOUNTER — Encounter: Payer: Managed Care, Other (non HMO) | Admitting: Family Medicine

## 2021-06-07 ENCOUNTER — Other Ambulatory Visit: Payer: Self-pay

## 2021-06-07 ENCOUNTER — Other Ambulatory Visit (INDEPENDENT_AMBULATORY_CARE_PROVIDER_SITE_OTHER): Payer: Managed Care, Other (non HMO)

## 2021-06-07 DIAGNOSIS — Z125 Encounter for screening for malignant neoplasm of prostate: Secondary | ICD-10-CM

## 2021-06-07 DIAGNOSIS — E785 Hyperlipidemia, unspecified: Secondary | ICD-10-CM

## 2021-06-07 DIAGNOSIS — R7303 Prediabetes: Secondary | ICD-10-CM

## 2021-06-07 DIAGNOSIS — Z1159 Encounter for screening for other viral diseases: Secondary | ICD-10-CM

## 2021-06-07 LAB — HEMOGLOBIN A1C: Hgb A1c MFr Bld: 6.7 % — ABNORMAL HIGH (ref 4.6–6.5)

## 2021-06-07 LAB — COMPREHENSIVE METABOLIC PANEL
ALT: 50 U/L (ref 0–53)
AST: 30 U/L (ref 0–37)
Albumin: 4.6 g/dL (ref 3.5–5.2)
Alkaline Phosphatase: 70 U/L (ref 39–117)
BUN: 17 mg/dL (ref 6–23)
CO2: 28 mEq/L (ref 19–32)
Calcium: 9.3 mg/dL (ref 8.4–10.5)
Chloride: 104 mEq/L (ref 96–112)
Creatinine, Ser: 0.99 mg/dL (ref 0.40–1.50)
GFR: 88.75 mL/min (ref >60.00)
Glucose, Bld: 128 mg/dL — ABNORMAL HIGH (ref 70–99)
Potassium: 4.2 mEq/L (ref 3.5–5.1)
Sodium: 140 mEq/L (ref 135–145)
Total Bilirubin: 1 mg/dL (ref 0.2–1.2)
Total Protein: 6.5 g/dL (ref 6.0–8.3)

## 2021-06-07 LAB — LIPID PANEL
Cholesterol: 157 mg/dL (ref 0–200)
HDL: 39 mg/dL — ABNORMAL LOW (ref >39.00)
LDL Cholesterol: 82 mg/dL (ref 0–99)
NonHDL: 117.65
Total CHOL/HDL Ratio: 4
Triglycerides: 177 mg/dL — ABNORMAL HIGH (ref 0.0–149.0)
VLDL: 35.4 mg/dL (ref 0.0–40.0)

## 2021-06-07 LAB — PSA: PSA: 1.08 ng/mL (ref 0.10–4.00)

## 2021-06-10 LAB — HEPATITIS C ANTIBODY
Hepatitis C Ab: NONREACTIVE
SIGNAL TO CUT-OFF: <0.02 (ref <1.00)

## 2021-06-14 ENCOUNTER — Ambulatory Visit (INDEPENDENT_AMBULATORY_CARE_PROVIDER_SITE_OTHER): Payer: Managed Care, Other (non HMO) | Admitting: Family Medicine

## 2021-06-14 ENCOUNTER — Other Ambulatory Visit: Payer: Self-pay

## 2021-06-14 ENCOUNTER — Encounter: Payer: Self-pay | Admitting: Family Medicine

## 2021-06-14 VITALS — BP 140/86 | HR 85 | Temp 97.7°F | Ht 69.0 in | Wt 198.5 lb

## 2021-06-14 DIAGNOSIS — E785 Hyperlipidemia, unspecified: Secondary | ICD-10-CM

## 2021-06-14 DIAGNOSIS — Z0000 Encounter for general adult medical examination without abnormal findings: Secondary | ICD-10-CM | POA: Diagnosis not present

## 2021-06-14 DIAGNOSIS — R209 Unspecified disturbances of skin sensation: Secondary | ICD-10-CM | POA: Insufficient documentation

## 2021-06-14 DIAGNOSIS — Z1211 Encounter for screening for malignant neoplasm of colon: Secondary | ICD-10-CM | POA: Diagnosis not present

## 2021-06-14 DIAGNOSIS — E1169 Type 2 diabetes mellitus with other specified complication: Secondary | ICD-10-CM | POA: Diagnosis not present

## 2021-06-14 NOTE — Assessment & Plan Note (Addendum)
Chronic, off medication. Reviewed indication for statin in new diabetes diagnosis - will work towards better sugar control  ?The 10-year ASCVD risk score (Arnett DK, et al., 2019) is: 7.1% ?  Values used to calculate the score: ?    Age: 51 years ?    Sex: Male ?    Is Non-Hispanic African American: No ?    Diabetic: Yes ?    Tobacco smoker: No ?    Systolic Blood Pressure: 003 mmHg ?    Is BP treated: No ?    HDL Cholesterol: 39 mg/dL ?    Total Cholesterol: 157 mg/dL  ?

## 2021-06-14 NOTE — Assessment & Plan Note (Signed)
Of unclear cause ?Not consistent with raynauds, no evidence of vascular disease.  ?Will monitor for now. ?

## 2021-06-14 NOTE — Assessment & Plan Note (Addendum)
New diagnosis - discussed this. rec renewed efforts at dietary modifications and regular weight bearing exercise. Offered diabetes education classes, he declines for now. RTC 4-6 mo DM f/u visit.  ?

## 2021-06-14 NOTE — Assessment & Plan Note (Signed)
Preventative protocols reviewed and updated unless pt declined. Discussed healthy diet and lifestyle.  

## 2021-06-14 NOTE — Patient Instructions (Addendum)
Pass by lab to pick up stool kit ?Consider shingrix vaccine.  ?A1c has crept into diabetes range- work on regular walking routine and low sugar low carb diet. Decrease added sugars, sweetened beverages in the diet.  ?Return in 4-6 months for diabetes recheck. ? ?Diabetes Mellitus and Nutrition, Adult ?When you have diabetes, or diabetes mellitus, it is very important to have healthy eating habits because your blood sugar (glucose) levels are greatly affected by what you eat and drink. Eating healthy foods in the right amounts, at about the same times every day, can help you: ?Manage your blood glucose. ?Lower your risk of heart disease. ?Improve your blood pressure. ?Reach or maintain a healthy weight. ?What can affect my meal plan? ?Every person with diabetes is different, and each person has different needs for a meal plan. Your health care provider may recommend that you work with a dietitian to make a meal plan that is best for you. Your meal plan may vary depending on factors such as: ?The calories you need. ?The medicines you take. ?Your weight. ?Your blood glucose, blood pressure, and cholesterol levels. ?Your activity level. ?Other health conditions you have, such as heart or kidney disease. ?How do carbohydrates affect me? ?Carbohydrates, also called carbs, affect your blood glucose level more than any other type of food. Eating carbs raises the amount of glucose in your blood. ?It is important to know how many carbs you can safely have in each meal. This is different for every person. Your dietitian can help you calculate how many carbs you should have at each meal and for each snack. ?How does alcohol affect me? ?Alcohol can cause a decrease in blood glucose (hypoglycemia), especially if you use insulin or take certain diabetes medicines by mouth. Hypoglycemia can be a life-threatening condition. Symptoms of hypoglycemia, such as sleepiness, dizziness, and confusion, are similar to symptoms of having too  much alcohol. ?Do not drink alcohol if: ?Your health care provider tells you not to drink. ?You are pregnant, may be pregnant, or are planning to become pregnant. ?If you drink alcohol: ?Limit how much you have to: ?0-1 drink a day for women. ?0-2 drinks a day for men. ?Know how much alcohol is in your drink. In the [place] one drink equals one 12 oz bottle of beer (355 mL), one 5 oz glass of wine (148 mL), or one 1? oz glass of hard liquor (44 mL). ?Keep yourself hydrated with water, diet soda, or unsweetened iced tea. Keep in mind that regular soda, juice, and other mixers may contain a lot of sugar and must be counted as carbs. ?What are tips for following this plan? ?Reading food labels ?Start by checking the serving size on the Nutrition Facts label of packaged foods and drinks. The number of calories and the amount of carbs, fats, and other nutrients listed on the label are based on one serving of the item. Many items contain more than one serving per package. ?Check the total grams (g) of carbs in one serving. ?Check the number of grams of saturated fats and trans fats in one serving. Choose foods that have a low amount or none of these fats. ?Check the number of milligrams (mg) of salt (sodium) in one serving. Most people should limit total sodium intake to less than 2,300 mg per day. ?Always check the nutrition information of foods labeled as "low-fat" or "nonfat." These foods may be higher in added sugar or refined carbs and should be avoided. ?Talk to your  dietitian to identify your daily goals for nutrients listed on the label. ?Shopping ?Avoid buying canned, pre-made, or processed foods. These foods tend to be high in fat, sodium, and added sugar. ?Shop around the outside edge of the grocery store. This is where you will most often find fresh fruits and vegetables, bulk grains, fresh meats, and fresh dairy products. ?Cooking ?Use low-heat cooking methods, such as baking, instead of high-heat cooking  methods, such as deep frying. ?Cook using healthy oils, such as olive, canola, or sunflower oil. ?Avoid cooking with butter, cream, or high-fat meats. ?Meal planning ?Eat meals and snacks regularly, preferably at the same times every day. Avoid going long periods of time without eating. ?Eat foods that are high in fiber, such as fresh fruits, vegetables, beans, and whole grains. ?Eat 4-6 oz (112-168 g) of lean protein each day, such as lean meat, chicken, fish, eggs, or tofu. One ounce (oz) (28 g) of lean protein is equal to: ?1 oz (28 g) of meat, chicken, or fish. ?1 egg. ?? cup (62 g) of tofu. ?Eat some foods each day that contain healthy fats, such as avocado, nuts, seeds, and fish. ?What foods should I eat? ?Fruits ?Berries. Apples. Oranges. Peaches. Apricots. Plums. Grapes. Mangoes. Papayas. Pomegranates. Kiwi. Cherries. ?Vegetables ?Leafy greens, including lettuce, spinach, kale, chard, collard greens, mustard greens, and cabbage. Beets. Cauliflower. Broccoli. Carrots. Green beans. Tomatoes. Peppers. Onions. Cucumbers. Brussels sprouts. ?Grains ?Whole grains, such as whole-wheat or whole-grain bread, crackers, tortillas, cereal, and pasta. Unsweetened oatmeal. Quinoa. Brown or wild rice. ?Meats and other proteins ?Seafood. Poultry without skin. Lean cuts of poultry and beef. Tofu. Nuts. Seeds. ?Dairy ?Low-fat or fat-free dairy products such as milk, yogurt, and cheese. ?The items listed above may not be a complete list of foods and beverages you can eat and drink. Contact a dietitian for more information. ?What foods should I avoid? ?Fruits ?Fruits canned with syrup. ?Vegetables ?Canned vegetables. Frozen vegetables with butter or cream sauce. ?Grains ?Refined white flour and flour products such as bread, pasta, snack foods, and cereals. Avoid all processed foods. ?Meats and other proteins ?Fatty cuts of meat. Poultry with skin. Breaded or fried meats. Processed meat. Avoid saturated fats. ?Dairy ?Full-fat  yogurt, cheese, or milk. ?Beverages ?Sweetened drinks, such as soda or iced tea. ?The items listed above may not be a complete list of foods and beverages you should avoid. Contact a dietitian for more information. ?Questions to ask a health care provider ?Do I need to meet with a certified diabetes care and education specialist? ?Do I need to meet with a dietitian? ?What number can I call if I have questions? ?When are the best times to check my blood glucose? ?Where to find more information: ?American Diabetes Association: diabetes.org ?Academy of Nutrition and Dietetics: eatright.org ?National Institute of Diabetes and Digestive and Kidney Diseases: niddk.nih.gov ?Association of Diabetes Care & Education Specialists: diabeteseducator.org ?Summary ?It is important to have healthy eating habits because your blood sugar (glucose) levels are greatly affected by what you eat and drink. It is important to use alcohol carefully. ?A healthy meal plan will help you manage your blood glucose and lower your risk of heart disease. ?Your health care provider may recommend that you work with a dietitian to make a meal plan that is best for you. ?This information is not intended to replace advice given to you by your health care provider. Make sure you discuss any questions you have with your health care provider. ?Document Revised: 10/19/2019 Document   Reviewed: 10/19/2019 ?Elsevier Patient Education ? [place]. ? ?

## 2021-06-14 NOTE — Progress Notes (Signed)
? ? Patient ID: Craig Wade, male    DOB: 06-17-1970, 51 y.o.   MRN: [id number] ? ?This visit was conducted in person. ? ?BP 140/86   Pulse 85   Temp 97.7 ?F (36.5 ?C) (Temporal)   Ht '5\' 9"'$  (1.753 m)   Wt 198 lb 8 oz (90 kg)   SpO2 97%   BMI 29.31 kg/m?   ? ?CC: CPE ?Subjective:  ? ?HPI: ?Craig Wade is a 51 y.o. male presenting on 06/14/2021 for Annual Exam ? ? ?R index finger intermittently gets cold - no loss of sensation, numbness, no changing color to the skin. Mostly noted when using small tools. This happens about 4 times a year. No h/o trauma to R index finger that he knows of. No neck pain, radiculitis symptoms.  ? ?Preventative: ?Colon cancer screening - iFOB negative 2022.  ?Prostate cancer screening - no fmhx. No nocturia or weakening of stream.  ?Lung cancer screening - not eligible  ?Flu shot - declines ?COVID vaccine - declined  ?Tdap 2016  ?Shingrix - discussed  ?Seat belt use discussed  ?Sunscreen use discussed. Denies changing moles.  ?Sleep - averaging 6-7 hours/night ?Non smoker  ?Alcohol - 1.5 oz/night liquor  ?Dentist - yearly - recent abscessed tooth treated with amoxicillin/clav planned root canal  ?Eye exam q3 yrs  ? ?Caffeine: 1 cup/day ?Lives with wife, 2 children, 1 dog  ?Occupation: Arboriculturist tile  ?Activity: no regular exercise  ?Diet: good water, some fruits/vegetables  ?   ? ?Relevant past medical, surgical, family and social history reviewed and updated as indicated. Interim medical history since our last visit reviewed. ?Allergies and medications reviewed and updated. ?Outpatient Medications Prior to Visit  ?Medication Sig Dispense Refill  ? cetirizine (ZYRTEC) 10 MG tablet Take 10 mg by mouth daily as needed for allergies.    ? ?No facility-administered medications prior to visit.  ?  ? ?Per HPI unless specifically indicated in ROS section below ?Review of Systems  ?Constitutional:  Negative for activity change, appetite change, chills, fatigue, fever and unexpected  weight change.  ?HENT:  Negative for hearing loss.   ?Eyes:  Negative for visual disturbance.  ?Respiratory:  Negative for cough, chest tightness, shortness of breath and wheezing.   ?Cardiovascular:  Negative for chest pain, palpitations and leg swelling.  ?Gastrointestinal:  Positive for nausea and vomiting. Negative for abdominal distention, abdominal pain, blood in stool, constipation and diarrhea.  ?     Recent food poisoning  ?Genitourinary:  Negative for difficulty urinating and hematuria.  ?Musculoskeletal:  Negative for arthralgias, myalgias and neck pain.  ?Skin:  Negative for rash.  ?Neurological:  Negative for dizziness, seizures, syncope and headaches.  ?Hematological:  Negative for adenopathy. Does not bruise/bleed easily.  ?Psychiatric/Behavioral:  Negative for dysphoric mood. The patient is not nervous/anxious.   ? ?Objective:  ?BP 140/86   Pulse 85   Temp 97.7 ?F (36.5 ?C) (Temporal)   Ht '5\' 9"'$  (1.753 m)   Wt 198 lb 8 oz (90 kg)   SpO2 97%   BMI 29.31 kg/m?   ?Wt Readings from Last 3 Encounters:  ?06/14/21 198 lb 8 oz (90 kg)  ?04/27/20 201 lb 7 oz (91.4 kg)  ?04/22/19 196 lb 1 oz (88.9 kg)  ?  ?  ?Physical Exam ?Vitals and nursing note reviewed.  ?Constitutional:   ?   General: He is not in acute distress. ?   Appearance: Normal appearance. He is well-developed. He is not ill-appearing.  ?  HENT:  ?   Head: Normocephalic and atraumatic.  ?   Right Ear: Hearing, tympanic membrane, ear canal and external ear normal.  ?   Left Ear: Hearing, tympanic membrane, ear canal and external ear normal.  ?Eyes:  ?   General: No scleral icterus. ?   Extraocular Movements: Extraocular movements intact.  ?   Conjunctiva/sclera: Conjunctivae normal.  ?   Pupils: Pupils are equal, round, and reactive to light.  ?Neck:  ?   Thyroid: No thyroid mass or thyromegaly.  ?Cardiovascular:  ?   Rate and Rhythm: Normal rate and regular rhythm.  ?   Pulses: Normal pulses.     ?     Radial pulses are 2+ on the right side  and 2+ on the left side.  ?   Heart sounds: Normal heart sounds. No murmur heard. ?Pulmonary:  ?   Effort: Pulmonary effort is normal. No respiratory distress.  ?   Breath sounds: Normal breath sounds. No wheezing, rhonchi or rales.  ?Abdominal:  ?   General: Bowel sounds are normal. There is no distension.  ?   Palpations: Abdomen is soft. There is no mass.  ?   Tenderness: There is no abdominal tenderness. There is no guarding or rebound.  ?   Hernia: No hernia is present.  ?Musculoskeletal:     ?   General: Normal range of motion.  ?   Cervical back: Normal range of motion and neck supple.  ?   Right lower leg: No edema.  ?   Left lower leg: No edema.  ?Lymphadenopathy:  ?   Cervical: No cervical adenopathy.  ?Skin: ?   General: Skin is warm and dry.  ?   Findings: No rash.  ?Neurological:  ?   General: No focal deficit present.  ?   Mental Status: He is alert and oriented to person, place, and time.  ?Psychiatric:     ?   Mood and Affect: Mood normal.     ?   Behavior: Behavior normal.     ?   Thought Content: Thought content normal.     ?   Judgment: Judgment normal.  ? ?   ?Results for orders placed or performed in visit on 06/07/21  ?Hepatitis C antibody  ?Result Value Ref Range  ? Hepatitis C Ab NON-REACTIVE NON-REACTIVE  ? SIGNAL TO CUT-OFF <0.02 <1.00  ?PSA  ?Result Value Ref Range  ? PSA 1.08 0.10 - 4.00 ng/mL  ?Hemoglobin A1c  ?Result Value Ref Range  ? Hgb A1c MFr Bld 6.7 (H) 4.6 - 6.5 %  ?Comprehensive metabolic panel  ?Result Value Ref Range  ? Sodium 140 135 - 145 mEq/L  ? Potassium 4.2 3.5 - 5.1 mEq/L  ? Chloride 104 96 - 112 mEq/L  ? CO2 28 19 - 32 mEq/L  ? Glucose, Bld 128 (H) 70 - 99 mg/dL  ? BUN 17 6 - 23 mg/dL  ? Creatinine, Ser 0.99 0.40 - 1.50 mg/dL  ? Total Bilirubin 1.0 0.2 - 1.2 mg/dL  ? Alkaline Phosphatase 70 39 - 117 U/L  ? AST 30 0 - 37 U/L  ? ALT 50 0 - 53 U/L  ? Total Protein 6.5 6.0 - 8.3 g/dL  ? Albumin 4.6 3.5 - 5.2 g/dL  ? GFR 88.75 >60.00 mL/min  ? Calcium 9.3 8.4 - 10.5 mg/dL   ?Lipid panel  ?Result Value Ref Range  ? Cholesterol 157 0 - 200 mg/dL  ? Triglycerides 177.0 (H) 0.0 -  149.0 mg/dL  ? HDL 39.00 (L) >39.00 mg/dL  ? VLDL 35.4 0.0 - 40.0 mg/dL  ? LDL Cholesterol 82 0 - 99 mg/dL  ? Total CHOL/HDL Ratio 4   ? NonHDL 117.65   ? ? ?Assessment & Plan:  ?This visit occurred during the SARS-CoV-2 public health emergency.  Safety protocols were in place, including screening questions prior to the visit, additional usage of staff PPE, and extensive cleaning of exam room while observing appropriate contact time as indicated for disinfecting solutions.  ? ?Problem List Items Addressed This Visit   ? ? Health maintenance examination - Primary (Chronic)  ?  Preventative protocols reviewed and updated unless pt declined. ?Discussed healthy diet and lifestyle.  ?  ?  ? Dyslipidemia associated with type 2 diabetes mellitus ([place])  ?  Chronic, off medication. Reviewed indication for statin in new diabetes diagnosis - will work towards better sugar control  ?The 10-year ASCVD risk score (Arnett DK, et al., 2019) is: 7.1% ?  Values used to calculate the score: ?    Age: 34 years ?    Sex: Male ?    Is Non-Hispanic African American: No ?    Diabetic: Yes ?    Tobacco smoker: No ?    Systolic Blood Pressure: 355 mmHg ?    Is BP treated: No ?    HDL Cholesterol: 39 mg/dL ?    Total Cholesterol: 157 mg/dL  ?  ?  ? Type 2 diabetes mellitus with other specified complication ([place])  ?  New diagnosis - discussed this. rec renewed efforts at dietary modifications and regular weight bearing exercise. Offered diabetes education classes, he declines for now. RTC 4-6 mo DM f/u visit.  ?  ?  ? Cold finger without peripheral vascular disease  ?  Of unclear cause ?Not consistent with raynauds, no evidence of vascular disease.  ?Will monitor for now. ?  ?  ? ?Other Visit Diagnoses   ? ? Special screening for malignant neoplasms, colon      ? Relevant Orders  ? Fecal occult blood, imunochemical  ? ?  ?  ? ?No orders of  the defined types were placed in this encounter. ? ?Orders Placed This Encounter  ?Procedures  ? Fecal occult blood, imunochemical  ?  Standing Status:   Future  ?  Standing Expiration Date:   06/15/2022  ? ? ? ?Fraser Din

## 2021-06-24 ENCOUNTER — Other Ambulatory Visit (INDEPENDENT_AMBULATORY_CARE_PROVIDER_SITE_OTHER): Payer: Managed Care, Other (non HMO)

## 2021-06-24 DIAGNOSIS — Z1211 Encounter for screening for malignant neoplasm of colon: Secondary | ICD-10-CM

## 2021-06-24 LAB — FECAL OCCULT BLOOD, IMMUNOCHEMICAL: Fecal Occult Bld: NEGATIVE

## 2021-10-15 ENCOUNTER — Encounter: Payer: Self-pay | Admitting: Family Medicine

## 2021-10-15 ENCOUNTER — Ambulatory Visit: Payer: Managed Care, Other (non HMO) | Admitting: Family Medicine

## 2021-10-15 VITALS — BP 140/84 | HR 63 | Temp 97.4°F | Ht 69.0 in | Wt 196.2 lb

## 2021-10-15 DIAGNOSIS — E1169 Type 2 diabetes mellitus with other specified complication: Secondary | ICD-10-CM

## 2021-10-15 DIAGNOSIS — R030 Elevated blood-pressure reading, without diagnosis of hypertension: Secondary | ICD-10-CM | POA: Diagnosis not present

## 2021-10-15 LAB — POCT GLYCOSYLATED HEMOGLOBIN (HGB A1C): Hemoglobin A1C: 6.3 % — AB (ref 4.0–5.6)

## 2021-10-15 NOTE — Assessment & Plan Note (Signed)
Consider tighter control given diabetes history.  Encouraged limiting salt, ensure good water intake.  Suggested continue monitoring BP at home.  Reassess control at CPE.

## 2021-10-15 NOTE — Patient Instructions (Addendum)
Schedule diabetic eye exam.  Sugars are back in prediabetes range - congrats!  Watch salt in the diet to keep good blood pressure control. Ensure good water intake.  Return as needed or in 8 months for physical.  Keep an eye on blood pressure control - goal <140/90.

## 2021-10-15 NOTE — Progress Notes (Signed)
Patient ID: GEVORK AYYAD, male    DOB: 1971-01-14, 51 y.o.   MRN: [id number]  This visit was conducted in person.  BP 140/84   Pulse 63   Temp (!) 97.4 F (36.3 C) (Temporal)   Ht '5\' 9"'$  (1.753 m)   Wt 196 lb 4 oz (89 kg)   SpO2 100%   BMI 28.98 kg/m   BP Readings from Last 3 Encounters:  10/15/21 140/84  06/14/21 140/86  04/27/20 138/90    CC: DM f/u visit  Subjective:   HPI: Craig Wade is a 51 y.o. male presenting on 10/15/2021 for Diabetes (Here for f/u.)   DM - does not regularly check sugars. Compliant with antihyperglycemic regimen which includes: diet controlled. Denies low sugars or hypoglycemic symptoms. Denies paresthesias, blurry vision. Last diabetic eye exam DUE. Glucometer brand: none. Last foot exam: DUE. DSME: declined. Lab Results  Component Value Date   HGBA1C 6.3 (A) 10/15/2021   Diabetic Foot Exam - Simple   Simple Foot Form Diabetic Foot exam was performed with the following findings: Yes 10/15/2021  8:54 AM  Visual Inspection No deformities, no ulcerations, no other skin breakdown bilaterally: Yes Sensation Testing Intact to touch and monofilament testing bilaterally: Yes Pulse Check Posterior Tibialis and Dorsalis pulse intact bilaterally: Yes Comments    No results found for: "MICROALBUR", "[id number]"   HTN - BP remains borderline.      Relevant past medical, surgical, family and social history reviewed and updated as indicated. Interim medical history since our last visit reviewed. Allergies and medications reviewed and updated. Outpatient Medications Prior to Visit  Medication Sig Dispense Refill   cetirizine (ZYRTEC) 10 MG tablet Take 10 mg by mouth daily as needed for allergies.     No facility-administered medications prior to visit.     Per HPI unless specifically indicated in ROS section below Review of Systems  Objective:  BP 140/84   Pulse 63   Temp (!) 97.4 F (36.3 C) (Temporal)   Ht '5\' 9"'$  (1.753 m)   Wt 196 lb  4 oz (89 kg)   SpO2 100%   BMI 28.98 kg/m   Wt Readings from Last 3 Encounters:  10/15/21 196 lb 4 oz (89 kg)  06/14/21 198 lb 8 oz (90 kg)  04/27/20 201 lb 7 oz (91.4 kg)      Physical Exam Vitals and nursing note reviewed.  Constitutional:      Appearance: Normal appearance. He is not ill-appearing.  Eyes:     Extraocular Movements: Extraocular movements intact.     Conjunctiva/sclera: Conjunctivae normal.     Pupils: Pupils are equal, round, and reactive to light.  Cardiovascular:     Rate and Rhythm: Normal rate and regular rhythm.     Pulses: Normal pulses.     Heart sounds: Normal heart sounds. No murmur heard. Pulmonary:     Effort: Pulmonary effort is normal. No respiratory distress.     Breath sounds: Normal breath sounds. No wheezing, rhonchi or rales.  Musculoskeletal:     Right lower leg: No edema.     Left lower leg: No edema.     Comments: See HPI for foot exam if done  Skin:    General: Skin is warm and dry.     Findings: No rash.  Neurological:     Mental Status: He is alert.  Psychiatric:        Mood and Affect: Mood normal.  Behavior: Behavior normal.       Results for orders placed or performed in visit on 10/15/21  POCT glycosylated hemoglobin (Hb A1C)  Result Value Ref Range   Hemoglobin A1C 6.3 (A) 4.0 - 5.6 %   HbA1c POC (<> result, manual entry)     HbA1c, POC (prediabetic range)     HbA1c, POC (controlled diabetic range)      Assessment & Plan:   Problem List Items Addressed This Visit     Type 2 diabetes mellitus with other specified complication ([place]) - Primary    Actually A1c improved into prediabetes range. Congratulated on better control. Continue diet control. Discussed ACEI for BP control, diabetic eye exam. Reassess at CPE.       Relevant Orders   POCT glycosylated hemoglobin (Hb A1C) (Completed)   Elevated blood-pressure reading without diagnosis of hypertension    Consider tighter control given diabetes history.   Encouraged limiting salt, ensure good water intake.  Suggested continue monitoring BP at home.  Reassess control at CPE.        No orders of the defined types were placed in this encounter.  Orders Placed This Encounter  Procedures   POCT glycosylated hemoglobin (Hb A1C)     Patient Instructions  Schedule diabetic eye exam.  Sugars are back in prediabetes range - congrats!  Watch salt in the diet to keep good blood pressure control. Ensure good water intake.  Return as needed or in 8 months for physical.  Keep an eye on blood pressure control - goal <140/90.   Follow up plan: Return in about 8 months (around 06/16/2022), or if symptoms worsen or fail to improve, for annual exam, prior fasting for blood work.  Ria Bush, MD

## 2021-10-15 NOTE — Assessment & Plan Note (Addendum)
Actually A1c improved into prediabetes range. Congratulated on better control. Continue diet control. Discussed ACEI for BP control, diabetic eye exam. Reassess at CPE.

## 2022-06-07 ENCOUNTER — Other Ambulatory Visit: Payer: Self-pay | Admitting: Family Medicine

## 2022-06-07 DIAGNOSIS — Z125 Encounter for screening for malignant neoplasm of prostate: Secondary | ICD-10-CM

## 2022-06-07 DIAGNOSIS — E1169 Type 2 diabetes mellitus with other specified complication: Secondary | ICD-10-CM

## 2022-06-10 ENCOUNTER — Other Ambulatory Visit (INDEPENDENT_AMBULATORY_CARE_PROVIDER_SITE_OTHER): Payer: Managed Care, Other (non HMO)

## 2022-06-10 DIAGNOSIS — Z125 Encounter for screening for malignant neoplasm of prostate: Secondary | ICD-10-CM

## 2022-06-10 DIAGNOSIS — E1169 Type 2 diabetes mellitus with other specified complication: Secondary | ICD-10-CM | POA: Diagnosis not present

## 2022-06-10 DIAGNOSIS — E785 Hyperlipidemia, unspecified: Secondary | ICD-10-CM | POA: Diagnosis not present

## 2022-06-10 LAB — COMPREHENSIVE METABOLIC PANEL
ALT: 32 U/L (ref 0–53)
AST: 22 U/L (ref 0–37)
Albumin: 4.4 g/dL (ref 3.5–5.2)
Alkaline Phosphatase: 71 U/L (ref 39–117)
BUN: 17 mg/dL (ref 6–23)
CO2: 29 mEq/L (ref 19–32)
Calcium: 9.5 mg/dL (ref 8.4–10.5)
Chloride: 103 mEq/L (ref 96–112)
Creatinine, Ser: 0.95 mg/dL (ref 0.40–1.50)
GFR: 92.6 mL/min (ref >60.00)
Glucose, Bld: 117 mg/dL — ABNORMAL HIGH (ref 70–99)
Potassium: 4.5 mEq/L (ref 3.5–5.1)
Sodium: 140 mEq/L (ref 135–145)
Total Bilirubin: 0.7 mg/dL (ref 0.2–1.2)
Total Protein: 6.6 g/dL (ref 6.0–8.3)

## 2022-06-10 LAB — MICROALBUMIN / CREATININE URINE RATIO
Creatinine,U: 142.7 mg/dL
Microalb Creat Ratio: 0.6 mg/g (ref 0.0–30.0)
Microalb, Ur: 0.9 mg/dL (ref 0.0–1.9)

## 2022-06-10 LAB — LIPID PANEL
Cholesterol: 165 mg/dL (ref 0–200)
HDL: 33.6 mg/dL — ABNORMAL LOW (ref >39.00)
NonHDL: 131.67
Total CHOL/HDL Ratio: 5
Triglycerides: 266 mg/dL — ABNORMAL HIGH (ref 0.0–149.0)
VLDL: 53.2 mg/dL — ABNORMAL HIGH (ref 0.0–40.0)

## 2022-06-10 LAB — PSA: PSA: 1.17 ng/mL (ref 0.10–4.00)

## 2022-06-10 LAB — HEMOGLOBIN A1C: Hgb A1c MFr Bld: 6.4 % (ref 4.6–6.5)

## 2022-06-10 LAB — LDL CHOLESTEROL, DIRECT: Direct LDL: 87 mg/dL

## 2022-06-17 ENCOUNTER — Ambulatory Visit (INDEPENDENT_AMBULATORY_CARE_PROVIDER_SITE_OTHER): Payer: Managed Care, Other (non HMO) | Admitting: Family Medicine

## 2022-06-17 ENCOUNTER — Encounter: Payer: Self-pay | Admitting: Family Medicine

## 2022-06-17 VITALS — BP 138/82 | HR 54 | Temp 97.1°F | Ht 69.0 in | Wt 196.1 lb

## 2022-06-17 DIAGNOSIS — Z0000 Encounter for general adult medical examination without abnormal findings: Secondary | ICD-10-CM | POA: Diagnosis not present

## 2022-06-17 DIAGNOSIS — E785 Hyperlipidemia, unspecified: Secondary | ICD-10-CM

## 2022-06-17 DIAGNOSIS — Z1211 Encounter for screening for malignant neoplasm of colon: Secondary | ICD-10-CM | POA: Diagnosis not present

## 2022-06-17 DIAGNOSIS — R7303 Prediabetes: Secondary | ICD-10-CM | POA: Diagnosis not present

## 2022-06-17 NOTE — Assessment & Plan Note (Signed)
Chronic stable off medication. Encouraged healthy diet choices to improve triglycerides.  The 10-year ASCVD risk score (Arnett DK, et al., 2019) is: 9.5%   Values used to calculate the score:     Age: 52 years     Sex: Male     Is Non-Hispanic African American: No     Diabetic: Yes     Tobacco smoker: No     Systolic Blood Pressure: 0000000 mmHg     Is BP treated: No     HDL Cholesterol: 33.6 mg/dL     Total Cholesterol: 165 mg/dL

## 2022-06-17 NOTE — Assessment & Plan Note (Signed)
A1c now in prediabetes range. Reviewed diet choices to keep sugars under control.

## 2022-06-17 NOTE — Progress Notes (Signed)
Patient ID: Craig Wade, male    DOB: 12-29-1970, 52 y.o.   MRN: [id number]  This visit was conducted in person.  BP 138/82   Pulse (!) 54   Temp (!) 97.1 F (36.2 C) (Temporal)   Ht 5\' 9"  (1.753 m)   Wt 196 lb 2 oz (89 kg)   SpO2 100%   BMI 28.96 kg/m    CC: CPE Subjective:   HPI: Craig Wade is a 52 y.o. male presenting on 06/17/2022 for Annual Exam   Preventative: Colon cancer screening - iFOB negative 2022.  Prostate cancer screening - no fmhx. Yearly PSA. No nocturia or weakening of stream.  Lung cancer screening - not eligible  Flu shot - declines COVID vaccine - declined  Tdap 2016  Shingrix - discussed  Seat belt use discussed  Sunscreen use discussed. Denies changing moles.  Sleep - averaging 6-7 hours/night  Non smoker  Alcohol - 1.5 oz/night liquor  Dentist - PRN Eye exam q3 yrs   Caffeine: 1 cup/day Lives with wife, 2 children, 1 dog  Occupation: Arboriculturist tile  Activity: no regular exercise  Diet: good water, some fruits/vegetables      Relevant past medical, surgical, family and social history reviewed and updated as indicated. Interim medical history since our last visit reviewed. Allergies and medications reviewed and updated. Outpatient Medications Prior to Visit  Medication Sig Dispense Refill   cetirizine (ZYRTEC) 10 MG tablet Take 10 mg by mouth daily as needed for allergies.     No facility-administered medications prior to visit.     Per HPI unless specifically indicated in ROS section below Review of Systems  Constitutional:  Negative for activity change, appetite change, chills, fatigue, fever and unexpected weight change.  HENT:  Negative for hearing loss.   Eyes:  Negative for visual disturbance.  Respiratory:  Negative for cough, chest tightness, shortness of breath and wheezing.   Cardiovascular:  Negative for chest pain, palpitations and leg swelling.  Gastrointestinal:  Negative for abdominal distention, abdominal  pain, blood in stool, constipation, diarrhea, nausea and vomiting.  Genitourinary:  Negative for difficulty urinating and hematuria.  Musculoskeletal:  Negative for arthralgias, myalgias and neck pain.  Skin:  Negative for rash.  Neurological:  Negative for dizziness, seizures, syncope and headaches.  Hematological:  Negative for adenopathy. Does not bruise/bleed easily.  Psychiatric/Behavioral:  Negative for dysphoric mood. The patient is not nervous/anxious.     Objective:  BP 138/82   Pulse (!) 54   Temp (!) 97.1 F (36.2 C) (Temporal)   Ht 5\' 9"  (1.753 m)   Wt 196 lb 2 oz (89 kg)   SpO2 100%   BMI 28.96 kg/m   Wt Readings from Last 3 Encounters:  06/17/22 196 lb 2 oz (89 kg)  10/15/21 196 lb 4 oz (89 kg)  06/14/21 198 lb 8 oz (90 kg)      Physical Exam Vitals and nursing note reviewed.  Constitutional:      General: He is not in acute distress.    Appearance: Normal appearance. He is well-developed. He is not ill-appearing.  HENT:     Head: Normocephalic and atraumatic.     Right Ear: Hearing, tympanic membrane, ear canal and external ear normal.     Left Ear: Hearing, tympanic membrane, ear canal and external ear normal.     Mouth/Throat:     Mouth: Mucous membranes are moist.     Pharynx: Oropharynx is clear. No oropharyngeal  exudate or posterior oropharyngeal erythema.  Eyes:     General: No scleral icterus.    Extraocular Movements: Extraocular movements intact.     Conjunctiva/sclera: Conjunctivae normal.     Pupils: Pupils are equal, round, and reactive to light.  Neck:     Thyroid: No thyroid mass or thyromegaly.  Cardiovascular:     Rate and Rhythm: Normal rate and regular rhythm.     Pulses: Normal pulses.          Radial pulses are 2+ on the right side and 2+ on the left side.     Heart sounds: Normal heart sounds. No murmur heard. Pulmonary:     Effort: Pulmonary effort is normal. No respiratory distress.     Breath sounds: Normal breath sounds. No  wheezing, rhonchi or rales.  Abdominal:     General: Bowel sounds are normal. There is no distension.     Palpations: Abdomen is soft. There is no mass.     Tenderness: There is no abdominal tenderness. There is no guarding or rebound.     Hernia: No hernia is present.  Musculoskeletal:        General: Normal range of motion.     Cervical back: Normal range of motion and neck supple.     Right lower leg: No edema.     Left lower leg: No edema.  Lymphadenopathy:     Cervical: No cervical adenopathy.  Skin:    General: Skin is warm and dry.     Findings: No rash.  Neurological:     General: No focal deficit present.     Mental Status: He is alert and oriented to person, place, and time.  Psychiatric:        Mood and Affect: Mood normal.        Behavior: Behavior normal.        Thought Content: Thought content normal.        Judgment: Judgment normal.       Results for orders placed or performed in visit on 06/10/22  PSA  Result Value Ref Range   PSA 1.17 0.10 - 4.00 ng/mL  Microalbumin / creatinine urine ratio  Result Value Ref Range   Microalb, Ur 0.9 0.0 - 1.9 mg/dL   Creatinine,U 142.7 mg/dL   Microalb Creat Ratio 0.6 0.0 - 30.0 mg/g  Hemoglobin A1c  Result Value Ref Range   Hgb A1c MFr Bld 6.4 4.6 - 6.5 %  Comprehensive metabolic panel  Result Value Ref Range   Sodium 140 135 - 145 mEq/L   Potassium 4.5 3.5 - 5.1 mEq/L   Chloride 103 96 - 112 mEq/L   CO2 29 19 - 32 mEq/L   Glucose, Bld 117 (H) 70 - 99 mg/dL   BUN 17 6 - 23 mg/dL   Creatinine, Ser 0.95 0.40 - 1.50 mg/dL   Total Bilirubin 0.7 0.2 - 1.2 mg/dL   Alkaline Phosphatase 71 39 - 117 U/L   AST 22 0 - 37 U/L   ALT 32 0 - 53 U/L   Total Protein 6.6 6.0 - 8.3 g/dL   Albumin 4.4 3.5 - 5.2 g/dL   GFR 92.60 >60.00 mL/min   Calcium 9.5 8.4 - 10.5 mg/dL  Lipid panel  Result Value Ref Range   Cholesterol 165 0 - 200 mg/dL   Triglycerides 266.0 (H) 0.0 - 149.0 mg/dL   HDL 33.60 (L) >39.00 mg/dL   VLDL 53.2  (H) 0.0 - 40.0 mg/dL   Total  CHOL/HDL Ratio 5    NonHDL 131.67   LDL cholesterol, direct  Result Value Ref Range   Direct LDL 87.0 mg/dL    Assessment & Plan:   Problem List Items Addressed This Visit     Health maintenance examination - Primary (Chronic)    Preventative protocols reviewed and updated unless pt declined. Discussed healthy diet and lifestyle.       Dyslipidemia associated with type 2 diabetes mellitus (HCC)    Chronic stable off medication. Encouraged healthy diet choices to improve triglycerides.  The 10-year ASCVD risk score (Arnett DK, et al., 2019) is: 9.5%   Values used to calculate the score:     Age: 80 years     Sex: Male     Is Non-Hispanic African American: No     Diabetic: Yes     Tobacco smoker: No     Systolic Blood Pressure: 0000000 mmHg     Is BP treated: No     HDL Cholesterol: 33.6 mg/dL     Total Cholesterol: 165 mg/dL       Prediabetes    A1c now in prediabetes range. Reviewed diet choices to keep sugars under control.       Other Visit Diagnoses     Special screening for malignant neoplasms, colon       Relevant Orders   Fecal occult blood, imunochemical        No orders of the defined types were placed in this encounter.   Orders Placed This Encounter  Procedures   Fecal occult blood, imunochemical    Standing Status:   Future    Standing Expiration Date:   06/17/2023    Patient Instructions  Pass by lab to pick up stool kit.  Consider shingrix shot to protect from shingles.  Good to see you today Return as needed or in 1 year for next physical.   Follow up plan: Return in about 1 year (around 06/17/2023) for annual exam, prior fasting for blood work.  Ria Bush, MD

## 2022-06-17 NOTE — Assessment & Plan Note (Signed)
Preventative protocols reviewed and updated unless pt declined. Discussed healthy diet and lifestyle.  

## 2022-06-17 NOTE — Patient Instructions (Addendum)
Pass by lab to pick up stool kit.  Consider shingrix shot to protect from shingles.  Good to see you today Return as needed or in 1 year for next physical.

## 2022-06-25 ENCOUNTER — Other Ambulatory Visit: Payer: Self-pay | Admitting: Radiology

## 2022-06-25 DIAGNOSIS — Z1211 Encounter for screening for malignant neoplasm of colon: Secondary | ICD-10-CM

## 2022-06-25 LAB — FECAL OCCULT BLOOD, IMMUNOCHEMICAL: Fecal Occult Bld: NEGATIVE

## 2023-06-07 ENCOUNTER — Other Ambulatory Visit: Payer: Self-pay | Admitting: Family Medicine

## 2023-06-07 DIAGNOSIS — Z125 Encounter for screening for malignant neoplasm of prostate: Secondary | ICD-10-CM

## 2023-06-07 DIAGNOSIS — E1169 Type 2 diabetes mellitus with other specified complication: Secondary | ICD-10-CM

## 2023-06-07 DIAGNOSIS — R7303 Prediabetes: Secondary | ICD-10-CM

## 2023-06-12 ENCOUNTER — Other Ambulatory Visit: Payer: Managed Care, Other (non HMO)

## 2023-06-12 DIAGNOSIS — R7303 Prediabetes: Secondary | ICD-10-CM

## 2023-06-12 DIAGNOSIS — E785 Hyperlipidemia, unspecified: Secondary | ICD-10-CM

## 2023-06-12 DIAGNOSIS — E1169 Type 2 diabetes mellitus with other specified complication: Secondary | ICD-10-CM

## 2023-06-12 DIAGNOSIS — Z125 Encounter for screening for malignant neoplasm of prostate: Secondary | ICD-10-CM

## 2023-06-12 LAB — LIPID PANEL
Cholesterol: 159 mg/dL (ref 0–200)
HDL: 38 mg/dL — ABNORMAL LOW (ref >39.00)
LDL Cholesterol: 85 mg/dL (ref 0–99)
NonHDL: 121.45
Total CHOL/HDL Ratio: 4
Triglycerides: 182 mg/dL — ABNORMAL HIGH (ref 0.0–149.0)
VLDL: 36.4 mg/dL (ref 0.0–40.0)

## 2023-06-12 LAB — COMPREHENSIVE METABOLIC PANEL
ALT: 37 U/L (ref 0–53)
AST: 23 U/L (ref 0–37)
Albumin: 4.7 g/dL (ref 3.5–5.2)
Alkaline Phosphatase: 74 U/L (ref 39–117)
BUN: 14 mg/dL (ref 6–23)
CO2: 28 meq/L (ref 19–32)
Calcium: 9 mg/dL (ref 8.4–10.5)
Chloride: 105 meq/L (ref 96–112)
Creatinine, Ser: 0.94 mg/dL (ref 0.40–1.50)
GFR: 93.12 mL/min (ref >60.00)
Glucose, Bld: 133 mg/dL — ABNORMAL HIGH (ref 70–99)
Potassium: 4.5 meq/L (ref 3.5–5.1)
Sodium: 141 meq/L (ref 135–145)
Total Bilirubin: 0.8 mg/dL (ref 0.2–1.2)
Total Protein: 6.8 g/dL (ref 6.0–8.3)

## 2023-06-12 LAB — PSA: PSA: 1.42 ng/mL (ref 0.10–4.00)

## 2023-06-12 LAB — HEMOGLOBIN A1C: Hgb A1c MFr Bld: 6.5 % (ref 4.6–6.5)

## 2023-06-19 ENCOUNTER — Ambulatory Visit (INDEPENDENT_AMBULATORY_CARE_PROVIDER_SITE_OTHER): Payer: Managed Care, Other (non HMO) | Admitting: Family Medicine

## 2023-06-19 ENCOUNTER — Encounter: Payer: Self-pay | Admitting: Family Medicine

## 2023-06-19 VITALS — BP 136/86 | HR 56 | Temp 97.9°F | Ht 69.0 in | Wt 198.2 lb

## 2023-06-19 DIAGNOSIS — B351 Tinea unguium: Secondary | ICD-10-CM

## 2023-06-19 DIAGNOSIS — E785 Hyperlipidemia, unspecified: Secondary | ICD-10-CM

## 2023-06-19 DIAGNOSIS — E1169 Type 2 diabetes mellitus with other specified complication: Secondary | ICD-10-CM | POA: Diagnosis not present

## 2023-06-19 DIAGNOSIS — Z0000 Encounter for general adult medical examination without abnormal findings: Secondary | ICD-10-CM | POA: Diagnosis not present

## 2023-06-19 DIAGNOSIS — Z1211 Encounter for screening for malignant neoplasm of colon: Secondary | ICD-10-CM

## 2023-06-19 DIAGNOSIS — E119 Type 2 diabetes mellitus without complications: Secondary | ICD-10-CM

## 2023-06-19 NOTE — Progress Notes (Signed)
 Ph: [phone] Fax: [phone]   Patient ID: Craig Wade, male    DOB: Jun 30, 1970, 53 y.o.   MRN: [id number]  This visit was conducted in person.  BP 136/86   Pulse (!) 56   Temp 97.9 F (36.6 C) (Oral)   Ht 5\' 9"  (1.753 m)   Wt 198 lb 4 oz (89.9 kg)   SpO2 96%   BMI 29.28 kg/m    CC: CPE Subjective:   HPI: Craig Wade is a 53 y.o. male presenting on 06/19/2023 for Annual Exam   [employer] for breakfast - cajun biscuit sandwich only eats 1 side of biscuit Brings wrap for lunch.  Drinks water.   Preventative: Colon cancer screening - iFOB negative 05/2022.  Prostate cancer screening - yearly PSA. No fmhx, no nocturia or weakening of stream.  Lung cancer screening - not eligible  Flu shot - declines COVID vaccine - declined  Pneumococcal - declined  Tdap 2016  Shingrix - discussed  Seat belt use discussed  Sunscreen use discussed. Denies changing moles.  Sleep - averaging 6-7 hours/night   Non smoker  Alcohol - 1.5 oz liquor/night  Dentist - yearly  Eye exam last seen 2024  Caffeine: 1 cup/day Lives with wife, 2 children, 1 dog  Occupation: Ecologist tile  Activity: no regular exercise - active at work  Diet: good water, some fruits/vegetables      Relevant past medical, surgical, family and social history reviewed and updated as indicated. Interim medical history since our last visit reviewed. Allergies and medications reviewed and updated. Outpatient Medications Prior to Visit  Medication Sig Dispense Refill   cetirizine (ZYRTEC) 10 MG tablet Take 10 mg by mouth daily as needed for allergies.     No facility-administered medications prior to visit.     Per HPI unless specifically indicated in ROS section below Review of Systems  Constitutional:  Negative for activity change, appetite change, chills, fatigue, fever and unexpected weight change.  HENT:  Negative for hearing loss.   Eyes:  Negative for visual disturbance.  Respiratory:   Negative for cough, chest tightness, shortness of breath and wheezing.   Cardiovascular:  Negative for chest pain, palpitations and leg swelling.  Gastrointestinal:  Negative for abdominal distention, abdominal pain, blood in stool, constipation, diarrhea, nausea and vomiting.  Genitourinary:  Negative for difficulty urinating and hematuria.  Musculoskeletal:  Negative for arthralgias, myalgias and neck pain.  Skin:  Negative for rash.  Neurological:  Negative for dizziness, seizures, syncope and headaches.  Hematological:  Negative for adenopathy. Does not bruise/bleed easily.  Psychiatric/Behavioral:  Negative for dysphoric mood. The patient is not nervous/anxious.     Objective:  BP 136/86   Pulse (!) 56   Temp 97.9 F (36.6 C) (Oral)   Ht 5\' 9"  (1.753 m)   Wt 198 lb 4 oz (89.9 kg)   SpO2 96%   BMI 29.28 kg/m   Wt Readings from Last 3 Encounters:  06/19/23 198 lb 4 oz (89.9 kg)  06/17/22 196 lb 2 oz (89 kg)  10/15/21 196 lb 4 oz (89 kg)      Physical Exam Vitals and nursing note reviewed.  Constitutional:      General: He is not in acute distress.    Appearance: Normal appearance. He is well-developed. He is not ill-appearing.  HENT:     Head: Normocephalic and atraumatic.     Right Ear: Hearing, tympanic membrane, ear canal and external ear normal.  Left Ear: Hearing, tympanic membrane, ear canal and external ear normal.     Mouth/Throat:     Mouth: Mucous membranes are moist.     Pharynx: Oropharynx is clear. No oropharyngeal exudate or posterior oropharyngeal erythema.  Eyes:     General: No scleral icterus.    Extraocular Movements: Extraocular movements intact.     Conjunctiva/sclera: Conjunctivae normal.     Pupils: Pupils are equal, round, and reactive to light.  Neck:     Thyroid: No thyroid mass or thyromegaly.     Vascular: No carotid bruit.  Cardiovascular:     Rate and Rhythm: Normal rate and regular rhythm.     Pulses: Normal pulses.           Radial pulses are 2+ on the right side and 2+ on the left side.     Heart sounds: Normal heart sounds. No murmur heard. Pulmonary:     Effort: Pulmonary effort is normal. No respiratory distress.     Breath sounds: Normal breath sounds. No wheezing, rhonchi or rales.  Abdominal:     General: Bowel sounds are normal. There is no distension.     Palpations: Abdomen is soft. There is no mass.     Tenderness: There is no abdominal tenderness. There is no guarding or rebound.     Hernia: No hernia is present.  Musculoskeletal:        General: Normal range of motion.     Cervical back: Normal range of motion and neck supple.     Right lower leg: No edema.     Left lower leg: No edema.  Lymphadenopathy:     Cervical: No cervical adenopathy.  Skin:    General: Skin is warm and dry.     Findings: No rash.  Neurological:     General: No focal deficit present.     Mental Status: He is alert and oriented to person, place, and time.  Psychiatric:        Mood and Affect: Mood normal.        Behavior: Behavior normal.        Thought Content: Thought content normal.        Judgment: Judgment normal.    Diabetic Foot Exam - Simple   Simple Foot Form Diabetic Foot exam was performed with the following findings: Yes 06/19/2023  8:26 AM  Visual Inspection No deformities, no ulcerations, no other skin breakdown bilaterally: Yes See comments: Yes Sensation Testing Intact to touch and monofilament testing bilaterally: Yes Pulse Check Posterior Tibialis and Dorsalis pulse intact bilaterally: Yes Comments No claudication 2+ PT pulses Onychomyosis to left 4th/5th toenails without interdigital maceration        Results for orders placed or performed in visit on 06/12/23  PSA   Collection Time: 06/12/23  7:44 AM  Result Value Ref Range   PSA 1.42 0.10 - 4.00 ng/mL  Hemoglobin A1c   Collection Time: 06/12/23  7:44 AM  Result Value Ref Range   Hgb A1c MFr Bld 6.5 4.6 - 6.5 %  Comprehensive  metabolic panel   Collection Time: 06/12/23  7:44 AM  Result Value Ref Range   Sodium 141 135 - 145 mEq/L   Potassium 4.5 3.5 - 5.1 mEq/L   Chloride 105 96 - 112 mEq/L   CO2 28 19 - 32 mEq/L   Glucose, Bld 133 (H) 70 - 99 mg/dL   BUN 14 6 - 23 mg/dL   Creatinine, Ser 4.09 0.40 - 1.50  mg/dL   Total Bilirubin 0.8 0.2 - 1.2 mg/dL   Alkaline Phosphatase 74 39 - 117 U/L   AST 23 0 - 37 U/L   ALT 37 0 - 53 U/L   Total Protein 6.8 6.0 - 8.3 g/dL   Albumin 4.7 3.5 - 5.2 g/dL   GFR 78.29 >56.21 mL/min   Calcium 9.0 8.4 - 10.5 mg/dL  Lipid panel   Collection Time: 06/12/23  7:44 AM  Result Value Ref Range   Cholesterol 159 0 - 200 mg/dL   Triglycerides 308.6 (H) 0.0 - 149.0 mg/dL   HDL 57.84 (L) >69.62 mg/dL   VLDL 95.2 0.0 - 84.1 mg/dL   LDL Cholesterol 85 0 - 99 mg/dL   Total CHOL/HDL Ratio 4    NonHDL 121.45     Assessment & Plan:   Problem List Items Addressed This Visit     Health maintenance examination - Primary (Chronic)   Preventative protocols reviewed and updated unless pt declined. Discussed healthy diet and lifestyle.       Dyslipidemia associated with type 2 diabetes mellitus (HCC)   Chronic, stable off medication. Reviewed diet choices to improve triglyceride levels.  Consider statin in diabetic.  The 10-year ASCVD risk score (Arnett DK, et al., 2019) is: 8.6%   Values used to calculate the score:     Age: 14 years     Sex: Male     Is Non-Hispanic African American: No     Diabetic: Yes     Tobacco smoker: No     Systolic Blood Pressure: 136 mmHg     Is BP treated: No     HDL Cholesterol: 38 mg/dL     Total Cholesterol: 159 mg/dL       Type 2 diabetes, diet controlled (HCC)   A1c back into diabetes range - discussed this.  Encouraged low sugar low carb diabetic diet Encouraged yearly diabetic eye exam      Onychomycosis   Discussed topical treatments      Other Visit Diagnoses       Special screening for malignant neoplasms, colon        Relevant Orders   Cologuard        No orders of the defined types were placed in this encounter.   Orders Placed This Encounter  Procedures   Cologuard    Patient Instructions  We will sign you up for Cologuard.  Sugars are back in diet controlled diabetes range - work on low sugar low carb diabetic diet - handout provided  Recommend yearly eye exam - we will request latest eye exam from [facility].  Return in 6 months for lab visit only (A1c check) Return in 1 year for next physical.   Follow up plan: Return in about 1 year (around 06/18/2024) for annual exam, prior fasting for blood work.  Eustaquio Boyden, MD

## 2023-06-19 NOTE — Patient Instructions (Addendum)
 We will sign you up for Cologuard.  Sugars are back in diet controlled diabetes range - work on low sugar low carb diabetic diet - handout provided  Recommend yearly eye exam - we will request latest eye exam from [facility].  Return in 6 months for lab visit only (A1c check) Return in 1 year for next physical.

## 2023-06-19 NOTE — Assessment & Plan Note (Addendum)
 Preventative protocols reviewed and updated unless pt declined. Discussed healthy diet and lifestyle.

## 2023-06-19 NOTE — Assessment & Plan Note (Signed)
 Chronic, stable off medication. Reviewed diet choices to improve triglyceride levels.  Consider statin in diabetic.  The 10-year ASCVD risk score (Arnett DK, et al., 2019) is: 8.6%   Values used to calculate the score:     Age: 53 years     Sex: Male     Is Non-Hispanic African American: No     Diabetic: Yes     Tobacco smoker: No     Systolic Blood Pressure: 136 mmHg     Is BP treated: No     HDL Cholesterol: 38 mg/dL     Total Cholesterol: 159 mg/dL

## 2023-06-19 NOTE — Assessment & Plan Note (Signed)
 Discussed topical treatments

## 2023-06-19 NOTE — Assessment & Plan Note (Addendum)
 A1c back into diabetes range - discussed this.  Encouraged low sugar low carb diabetic diet Encouraged yearly diabetic eye exam

## 2023-07-06 LAB — COLOGUARD: COLOGUARD: NEGATIVE

## 2023-07-07 ENCOUNTER — Encounter: Payer: Self-pay | Admitting: Family Medicine

## 2023-12-20 ENCOUNTER — Other Ambulatory Visit: Payer: Self-pay | Admitting: Family Medicine

## 2023-12-20 DIAGNOSIS — E119 Type 2 diabetes mellitus without complications: Secondary | ICD-10-CM

## 2023-12-21 ENCOUNTER — Other Ambulatory Visit (INDEPENDENT_AMBULATORY_CARE_PROVIDER_SITE_OTHER)

## 2023-12-21 DIAGNOSIS — E119 Type 2 diabetes mellitus without complications: Secondary | ICD-10-CM | POA: Diagnosis not present

## 2023-12-21 LAB — POCT GLYCOSYLATED HEMOGLOBIN (HGB A1C): Hemoglobin A1C: 6.6 % — AB (ref 4.0–5.6)

## 2023-12-22 ENCOUNTER — Ambulatory Visit: Payer: Self-pay | Admitting: Family Medicine

## 2024-06-14 ENCOUNTER — Other Ambulatory Visit

## 2024-06-21 ENCOUNTER — Encounter: Admitting: Family Medicine
